# Patient Record
Sex: Female | Born: 1985 | Hispanic: Yes | Smoking: Never smoker
Health system: Southern US, Community
[De-identification: ages and names within clinical notes are randomized; demographics above are authoritative.]

## PROBLEM LIST (undated history)

## (undated) ENCOUNTER — Inpatient Hospital Stay (HOSPITAL_COMMUNITY): Payer: Self-pay

## (undated) DIAGNOSIS — D696 Thrombocytopenia, unspecified: Secondary | ICD-10-CM

## (undated) DIAGNOSIS — F32A Depression, unspecified: Secondary | ICD-10-CM

## (undated) DIAGNOSIS — F329 Major depressive disorder, single episode, unspecified: Secondary | ICD-10-CM

## (undated) DIAGNOSIS — A609 Anogenital herpesviral infection, unspecified: Secondary | ICD-10-CM

## (undated) HISTORY — PX: NO PAST SURGERIES: SHX2092

---
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
## 2007-03-23 ENCOUNTER — Ambulatory Visit: Payer: Self-pay | Admitting: Family Medicine

## 2009-05-15 ENCOUNTER — Emergency Department: Payer: Self-pay | Admitting: Emergency Medicine

## 2010-05-16 ENCOUNTER — Emergency Department: Payer: Self-pay | Admitting: Emergency Medicine

## 2010-05-20 ENCOUNTER — Ambulatory Visit: Payer: Self-pay | Admitting: Internal Medicine

## 2013-08-05 ENCOUNTER — Emergency Department: Payer: Self-pay | Admitting: Emergency Medicine

## 2013-11-15 ENCOUNTER — Inpatient Hospital Stay (HOSPITAL_COMMUNITY)
Admission: AD | Admit: 2013-11-15 | Discharge: 2013-11-16 | Disposition: A | Discharge status: Home / Self Care | Payer: Self-pay | Source: Ambulatory Visit | Attending: Obstetrics & Gynecology | Admitting: Obstetrics & Gynecology

## 2013-11-15 ENCOUNTER — Encounter (HOSPITAL_COMMUNITY): Payer: Self-pay | Admitting: *Deleted

## 2013-11-15 DIAGNOSIS — R109 Unspecified abdominal pain: Secondary | ICD-10-CM | POA: Insufficient documentation

## 2013-11-15 DIAGNOSIS — M549 Dorsalgia, unspecified: Secondary | ICD-10-CM | POA: Insufficient documentation

## 2013-11-15 DIAGNOSIS — O034 Incomplete spontaneous abortion without complication: Secondary | ICD-10-CM | POA: Insufficient documentation

## 2013-11-15 LAB — POCT PREGNANCY, URINE: Preg Test, Ur: POSITIVE — AB

## 2013-11-15 NOTE — MAU Note (Signed)
Pt states she has been having cramping and bleeding today. Pt states bleeding started 2wks ago.Pt states she started having cramping yesterday and became worse today.

## 2013-11-15 NOTE — MAU Note (Signed)
Pt G2 P0, LMP 08/30/2013, having lower back pain, cramping and vaginal bleeding.

## 2013-11-15 NOTE — MAU Provider Note (Signed)
Chief Complaint: Back Pain, Abdominal Cramping and Vaginal Bleeding   First Provider Initiated Contact with Patient 11/16/13 0047     SUBJECTIVE HPI: Monica Buck is a 28 y.o. G2P0010 at [redacted]w[redacted]d by LMP who presents with mid low abd cramping and bleeding x 2 weeks, worse tonight. No passage of tissue. States she has blood pregnancy test done in [place] 2 weeks ago when bleeding started, but no other testing or F/U. Does not know result. Bleeding started as spotting, but is a little heavier now. Used two pads today. Unplanned pregnancy.   Denies fever, chills, dizziness, passage of tissue, urinary complaints, GI complaints.  History reviewed. No pertinent past medical history. OB History  Gravida Para Term Preterm AB SAB TAB Ectopic Multiple Living  2    1 1         # Outcome Date GA Lbr Len/2nd Weight Sex Delivery Anes PTL Lv  2 CUR           1 SAB              No past surgical history on file. History   Social History  . Marital Status: Single    Spouse Name: N/A    Number of Children: N/A  . Years of Education: N/A   Occupational History  . Not on file.   Social History Main Topics  . Smoking status: Not on file  . Smokeless tobacco: Not on file  . Alcohol Use: Not on file  . Drug Use: Not on file  . Sexual Activity: Yes   Other Topics Concern  . Not on file   Social History Narrative  . No narrative on file   No current facility-administered medications on file prior to encounter.   No current outpatient prescriptions on file prior to encounter.   Allergies  Allergen Reactions  . Pork-Derived Products Rash   ROS: Pertinent items in HPI  OBJECTIVE Blood pressure 114/57, pulse 76, temperature 98.3 F (36.8 C), temperature source Oral, resp. rate 16, height 5\' 3"  (1.6 m), weight 63.594 kg (140 lb 3.2 oz), last menstrual period 08/30/2013. GENERAL: Well-developed, well-nourished female in no acute distress. Tearful.  HEENT: Normocephalic HEART: normal  rate RESP: normal effort ABDOMEN: Soft, NT. No CVAT.   EXTREMITIES: Nontender, no edema NEURO: Alert and oriented SPECULUM EXAM: Refused. Small amount of BRB on pad.   LAB RESULTS Results for orders placed during the hospital encounter of 11/15/13 (from the past 24 hour(s))  URINALYSIS, ROUTINE W REFLEX MICROSCOPIC     Status: Abnormal   Collection Time    11/15/13 11:20 PM      Result Value Range   Color, Urine YELLOW  YELLOW   APPearance CLEAR  CLEAR   Specific Gravity, Urine 1.010  1.005 - 1.030   pH 6.5  5.0 - 8.0   Glucose, UA NEGATIVE  NEGATIVE mg/dL   Hgb urine dipstick LARGE (*) NEGATIVE   Bilirubin Urine NEGATIVE  NEGATIVE   Ketones, ur NEGATIVE  NEGATIVE mg/dL   Protein, ur NEGATIVE  NEGATIVE mg/dL   Urobilinogen, UA 0.2  0.0 - 1.0 mg/dL   Nitrite NEGATIVE  NEGATIVE   Leukocytes, UA NEGATIVE  NEGATIVE  URINE MICROSCOPIC-ADD ON     Status: None   Collection Time    11/15/13 11:20 PM      Result Value Range   Squamous Epithelial / LPF RARE  RARE   WBC, UA 0-2  <3 WBC/hpf   RBC / HPF 3-6  <3 RBC/hpf  Bacteria, UA RARE  RARE  POCT PREGNANCY, URINE     Status: Abnormal   Collection Time    11/15/13 11:28 PM      Result Value Range   Preg Test, Ur POSITIVE (*) NEGATIVE  ABO/RH     Status: None   Collection Time    11/16/13 12:00 AM      Result Value Range   ABO/RH(D) O POS    HCG, QUANTITATIVE, PREGNANCY     Status: Abnormal   Collection Time    11/16/13 12:00 AM      Result Value Range   hCG, Beta Chain, Quant, S 4974 (*) <5 mIU/mL  CBC     Status: Abnormal   Collection Time    11/16/13 12:00 AM      Result Value Range   WBC 5.9  4.0 - 10.5 K/uL   RBC 3.81 (*) 3.87 - 5.11 MIL/uL   Hemoglobin 11.9 (*) 12.0 - 15.0 g/dL   HCT 33.7 (*) 36.0 - 46.0 %   MCV 88.5  78.0 - 100.0 fL   MCH 31.2  26.0 - 34.0 pg   MCHC 35.3  30.0 - 36.0 g/dL   RDW 12.3  11.5 - 15.5 %   Platelets 125 (*) 150 - 400 K/uL    IMAGING US Ob Comp Less 14 Wks  11/16/2013   CLINICAL  DATA:  Pelvic cramping and vaginal bleeding.  EXAM: OBSTETRIC <14 WK [place] AND TRANSVAGINAL OB US  TECHNIQUE: Both transabdominal and transvaginal ultrasound examinations were performed for complete evaluation of the gestation as well as the maternal uterus, adnexal regions, and pelvic cul-de-sac. Transvaginal technique was performed to assess early pregnancy.  COMPARISON:  None.  FINDINGS: Intrauterine gestational sac: Visualized / somewhat elongated in appearance.  Yolk sac:  No  Embryo:  No  Cardiac Activity: N/A  MSD: 23.5 mm   7 w   3  d  Maternal uterus/adnexae: No subchorionic hemorrhage is noted. The uterus is otherwise unremarkable in appearance.  The ovaries are within normal limits. The right ovary measures 2.2 x 1.4 x 1.3 cm, while the left ovary measures 2.5 x 1.8 x 1.9 cm. No suspicious adnexal masses are seen; there is no evidence for ovarian torsion.  No free fluid is seen within the pelvic cul-de-sac.  IMPRESSION: Single intrauterine gestational sac noted within the uterus, somewhat elongated in appearance. No yolk sac or embryo again seen. Findings are suspicious but not yet definitive for failed pregnancy. Recommend follow-up US in 10-14 days for definitive diagnosis. This recommendation follows SRU consensus guidelines: Diagnostic Criteria for Nonviable Pregnancy Early in the First Trimester. [place] [place] Med 2013WM:[id number].   Electronically Signed   By: Garald Balding M.D.   On: 11/16/2013 00:42   US Ob Transvaginal  11/16/2013   CLINICAL DATA:  Pelvic cramping and vaginal bleeding.  EXAM: OBSTETRIC <14 WK [place] AND TRANSVAGINAL OB US  TECHNIQUE: Both transabdominal and transvaginal ultrasound examinations were performed for complete evaluation of the gestation as well as the maternal uterus, adnexal regions, and pelvic cul-de-sac. Transvaginal technique was performed to assess early pregnancy.  COMPARISON:  None.  FINDINGS: Intrauterine gestational sac: Visualized / somewhat elongated in  appearance.  Yolk sac:  No  Embryo:  No  Cardiac Activity: N/A  MSD: 23.5 mm   7 w   3  d  Maternal uterus/adnexae: No subchorionic hemorrhage is noted. The uterus is otherwise unremarkable in appearance.  The ovaries are within normal limits. The  right ovary measures 2.2 x 1.4 x 1.3 cm, while the left ovary measures 2.5 x 1.8 x 1.9 cm. No suspicious adnexal masses are seen; there is no evidence for ovarian torsion.  No free fluid is seen within the pelvic cul-de-sac.  IMPRESSION: Single intrauterine gestational sac noted within the uterus, somewhat elongated in appearance. No yolk sac or embryo again seen. Findings are suspicious but not yet definitive for failed pregnancy. Recommend follow-up US in 10-14 days for definitive diagnosis. This recommendation follows SRU consensus guidelines: Diagnostic Criteria for Nonviable Pregnancy Early in the First Trimester. [employer] [employer] Med 2013; 220:2542-70.   Electronically Signed   By: Garald Balding M.D.   On: 11/16/2013 00:42   MAU COURSE Lengthy discussion w/ pt and FOB about US findings. Recommended F/U appt in clinic in 1 week and repeat US for confirmation of SAB. Can then discuss management options. Pt wants cytotec now. Does not want to wait for F/U US. Offered interpreter to discuss results, management. Declined.        Early Intrauterine Pregnancy Failure  _X__  Documented intrauterine pregnancy failure less than or equal to [redacted] weeks gestation  _X__  No serious current illness  _X__  Baseline Hgb greater than or equal to 10g/dl  _X__  Patient has easily accessible transportation to the hospital  _X__  Clear preference  _X__  Practitioner/physician deems patient reliable  _X__  Counseling by practitioner or physician  _X__  Patient education by RN  _X__  Consent given  _NA_  Rho-Gam given by RN if indicated  _X__ Medication dispensed   _X__   Cytotec 800 mcg  __   Intravaginally by patient at home         _X_   Intravaginally by RN in  MAU        __   Rectally by patient at home        __   Rectally by RN in MAU  ___  Ibuprofen 600 mg 1 tablet by mouth every 6 hours as needed #30  _X__  Hydrocodone/acetaminophen 5/325 mg by mouth every 4 to 6 hours as needed  _X__  Phenergan 12.5 mg by mouth every 4 hours as needed for nausea   ASSESSMENT 1. Incomplete miscarriage    PLAN Discharge home in stable condition.  Quant 11/19/2013 at [facility] . SAB precautions. Support given.  GC/CT pending Follow-up Information   Follow up with [facility] In 1 week.   Specialty:  Obstetrics and Gynecology   Contact information:   [place] [address] [phone]      Follow up with [facility]. (As needed if symptoms worsen)    Contact information:   [address] [id number] [place] [address] [phone]         Follow-up Information   Follow up with [facility] In 1 week.   Specialty:  Obstetrics and Gynecology   Contact information:   [place] [address] [phone]      Follow up with [facility]. (As needed if symptoms worsen)    Contact information:   [address] [id number] [place] [address] [phone]       Medication List         oxyCODONE-acetaminophen 5-325 MG per tablet  Commonly known as:  PERCOCET/ROXICET  Take 1-2 tablets by mouth every 4 (four) hours as needed for severe  pain.     promethazine 50 MG tablet  Commonly known as:  PHENERGAN  Take 1 tablet (50 mg total) by mouth every 6 (six) hours as needed for nausea or vomiting.       [place], CNM 11/16/2013  1:34 AM

## 2013-11-16 ENCOUNTER — Inpatient Hospital Stay (HOSPITAL_COMMUNITY): Payer: Self-pay

## 2013-11-16 DIAGNOSIS — O034 Incomplete spontaneous abortion without complication: Secondary | ICD-10-CM

## 2013-11-16 LAB — URINALYSIS, ROUTINE W REFLEX MICROSCOPIC
BILIRUBIN URINE: NEGATIVE
GLUCOSE, UA: NEGATIVE mg/dL
KETONES UR: NEGATIVE mg/dL
Leukocytes, UA: NEGATIVE
Nitrite: NEGATIVE
PH: 6.5 (ref 5.0–8.0)
Protein, ur: NEGATIVE mg/dL
Specific Gravity, Urine: 1.01 (ref 1.005–1.030)
Urobilinogen, UA: 0.2 mg/dL (ref 0.0–1.0)

## 2013-11-16 LAB — CBC
HEMATOCRIT: 33.7 % — AB (ref 36.0–46.0)
Hemoglobin: 11.9 g/dL — ABNORMAL LOW (ref 12.0–15.0)
MCH: 31.2 pg (ref 26.0–34.0)
MCHC: 35.3 g/dL (ref 30.0–36.0)
MCV: 88.5 fL (ref 78.0–100.0)
Platelets: 125 10*3/uL — ABNORMAL LOW (ref 150–400)
RBC: 3.81 MIL/uL — AB (ref 3.87–5.11)
RDW: 12.3 % (ref 11.5–15.5)
WBC: 5.9 10*3/uL (ref 4.0–10.5)

## 2013-11-16 LAB — ABO/RH: ABO/RH(D): O POS

## 2013-11-16 LAB — HCG, QUANTITATIVE, PREGNANCY: HCG, BETA CHAIN, QUANT, S: 4974 m[IU]/mL — AB (ref <5)

## 2013-11-16 LAB — URINE MICROSCOPIC-ADD ON

## 2013-11-16 MED ORDER — OXYCODONE-ACETAMINOPHEN 5-325 MG PO TABS
2.0000 | ORAL_TABLET | Freq: Once | ORAL | Status: AC
Start: 2013-11-16 — End: 2013-11-16
  Administered 2013-11-16: 2 via ORAL
  Filled 2013-11-16: qty 2

## 2013-11-16 MED ORDER — PROMETHAZINE HCL 50 MG PO TABS
50.0000 mg | ORAL_TABLET | Freq: Four times a day (QID) | ORAL | Status: DC | PRN
Start: 2013-11-16 — End: 2014-03-29

## 2013-11-16 MED ORDER — OXYCODONE-ACETAMINOPHEN 5-325 MG PO TABS: 1.0000 | ORAL_TABLET | ORAL | Status: DC | PRN

## 2013-11-16 MED ORDER — MISOPROSTOL 200 MCG PO TABS
800.0000 ug | ORAL_TABLET | Freq: Once | ORAL | Status: AC
  Administered 2013-11-16: 800 ug via VAGINAL
  Filled 2013-11-16: qty 4

## 2013-11-16 NOTE — Discharge Instructions (Signed)
Aborto espontáneo  °(Miscarriage) °El aborto espontáneo es la pérdida de un bebé que no ha nacido (feto) antes de la semana 20 del embarazo. La mayor parte de estos abortos ocurre en los primeros 3 meses. En algunos casos ocurre antes de que la mujer sepa que está embarazada. También se denomina "aborto espontáneo" o "pérdida prematura del embarazo". El aborto espontáneo puede ser una experiencia que afecte emocionalmente a la persona. Converse con su médico si tiene dudas, cómo es el proceso de duelo, y sobre planes futuros de embarazo.  °CAUSAS  °· Algunos problemas cromosómicos pueden hacer imposible que el bebé se desarrolle normalmente. Los problemas con los genes o cromosomas del bebé son generalmente el resultado de errores que se producen, por casualidad, cuando el embrión se divide y crece. Estos problemas no se heredan de los padres. °· Infección en el cuello del útero.   °· Problemas hormonales.   °· Problemas en el cuello del útero, como tener un útero incompetente. Esto ocurre cuando los tejidos no son lo suficientemente fuertes como para contener el embarazo.   °· Problemas del útero, como un útero con forma anormal, los fibromas o anormalidades congénitas.   °· Ciertas enfermedades crónicas.   °· No fume, no beba alcohol, ni consuma drogas.   °· Traumatismos   °A veces, la causa es desconocida.  °SÍNTOMAS  °· Sangrado o manchado vaginal, con o sin cólicos o dolor. °· Dolor o cólicos en el abdomen o en la cintura. °· Eliminación de líquido, tejidos o coágulos grandes por la vagina. °DIAGNÓSTICO  °El médico le hará un examen físico. También le indicará una ecografía para confirmar el aborto. Es posible que se realicen análisis de sangre.  °TRATAMIENTO  °· En algunos casos el tratamiento no es necesario, si se eliminan naturalmente todos los tejidos embrionarios que se encontraban en el útero. Si el feto o la placenta quedan dentro del útero (aborto incompleto), pueden infectarse, los tejidos que quedan  pueden infectarse y deben retirarse. Generalmente se realiza un procedimiento de dilatación y curetaje (D y C). Durante el procedimiento de dilatación y curetaje, el cuello del útero se abre (dilata) y se retira cualquier resto de tejido fetal o placentario del útero. °· Si hay una infección, le recetarán antibióticos. Podrán recetarle otros medicamentos para reducir el tamaño del útero (contraerlo) si hay una mucho sangrado. °· Si su sangre es Rh negativa y su bebé es Rh positivo, usted necesitará la inyección de inmunoglobulina Rh. Esta inyección protegerá a los futuros bebés de tener problemas de compatibilidad Rh en futuros embarazos. °INSTRUCCIONES PARA EL CUIDADO EN EL HOGAR  °· El médico le indicará reposo en cama o le permitirá realizar actividades livianas. Vuelva a la actividad lentamente o según las indicaciones de su médico. °· Pídale a alguien que la ayude con las responsabilidades familiares y del hogar durante este tiempo.   °· Lleve un registro de la cantidad y la saturación de las toallas higiénicas que utiliza cada día. Anote esta información   °· No use tampones. No No se haga duchas vaginales ni tenga relaciones sexuales hasta que el médico la autorice.   °· Sólo tome medicamentos de venta libre o recetados para calmar el dolor o el malestar, según las indicaciones de su médico.   °· No tome aspirina. La aspirina puede ocasionar hemorragias.   °· Concurra puntualmente a las citas de control con el médico.   °· Si usted o su pareja tienen dificultades con el duelo, hable con su médico para buscar la ayuda psicológica que los ayude a enfrentar la pérdida   del embarazo. Permítase el tiempo suficiente de duelo antes de quedar embarazada nuevamente.   °SOLICITE ATENCIÓN MÉDICA DE INMEDIATO SI:  °· Siente calambres intensos o dolor en la espalda o en el abdomen. °· Tiene fiebre. °· Elimina grandes coágulos de sangre (del tamaño de una nuez o más) o tejidos por la vagina. Guarde lo que ha eliminado para  que su médico lo examine.   °· La hemorragia aumenta.   °· Observa una secreción vaginal espesa y con mal olor. °· Se siente mareada, débil, o se desmaya.   °· Siente escalofríos.   °ASEGÚRESE DE QUE:  °· Comprende estas instrucciones. °· Controlará su enfermedad. °· Solicitará ayuda de inmediato si no mejora o si empeora. °Document Released: 07/21/2005 Document Revised: 02/05/2013 °ExitCare® Patient Information ©2014 [employer]. ° °

## 2013-11-22 ENCOUNTER — Other Ambulatory Visit: Payer: Self-pay

## 2013-11-22 DIAGNOSIS — O034 Incomplete spontaneous abortion without complication: Secondary | ICD-10-CM

## 2013-11-23 LAB — HCG, QUANTITATIVE, PREGNANCY: hCG, Beta Chain, Quant, S: 95.6 m[IU]/mL

## 2013-11-23 NOTE — MAU Provider Note (Signed)
Attestation of Attending Supervision of Advanced Practitioner (CNM/NP): Evaluation and management procedures were performed by the Advanced Practitioner under my supervision and collaboration. I have reviewed the Advanced Practitioner's note and chart, and I agree with the management and plan.  LEGGETT,KELLY H. 5:02 PM

## 2013-11-26 ENCOUNTER — Telehealth: Payer: Self-pay | Admitting: *Deleted

## 2013-11-26 NOTE — Telephone Encounter (Signed)
Message copied by Samuel Germany on Mon Nov 26, 2013  9:06 AM ------      Message from: Guss Bunde      Created: Fri Nov 23, 2013  4:00 PM       Pt has had substantial drop in BHCG.  Clinic staff to call pt to return in 1 week for BHCG. (1 week from last blood draw). ------

## 2013-11-26 NOTE — Telephone Encounter (Signed)
Called [facility] and unable to leave a message- heard a message voicemail not set up.

## 2013-11-28 NOTE — Telephone Encounter (Signed)
Called pt and informed pt that her beta levels have dropped significantly and that the provider would like for her to come in on Thursday for another quant.  Pt stated that she would be able to come in on 11/29/12 at 3pm.

## 2013-11-29 ENCOUNTER — Other Ambulatory Visit: Payer: Self-pay

## 2013-11-29 DIAGNOSIS — O034 Incomplete spontaneous abortion without complication: Secondary | ICD-10-CM

## 2013-11-30 LAB — HCG, QUANTITATIVE, PREGNANCY: hCG, Beta Chain, Quant, S: 7.8 m[IU]/mL

## 2013-12-06 ENCOUNTER — Ambulatory Visit (INDEPENDENT_AMBULATORY_CARE_PROVIDER_SITE_OTHER): Payer: Self-pay | Admitting: Nurse Practitioner

## 2013-12-06 ENCOUNTER — Encounter: Payer: Self-pay | Admitting: Nurse Practitioner

## 2013-12-06 VITALS — BP 99/51 | HR 67 | Ht 63.0 in | Wt 139.2 lb

## 2013-12-06 DIAGNOSIS — O039 Complete or unspecified spontaneous abortion without complication: Secondary | ICD-10-CM | POA: Insufficient documentation

## 2013-12-06 NOTE — Progress Notes (Addendum)
History:  Monica Buck a 28 y.o. G2P0020 who presents to [facility] today for follow up om SAB from Jan 23. She has been followed down to BHCG of 7.8 on 2/5. She denies any pain or bleeding. She would like to try again asap for pregnancy  The following portions of the patient's history were reviewed and updated as appropriate: allergies, current medications, past family history, past medical history, past social history, past surgical history and problem list.  Review of Systems:  Pertinent items are noted in HPI.  Objective:  Physical Exam BP 99/51  Pulse 67  Ht 5\' 3"  (1.6 m)  Wt 139 lb 3.2 oz (63.141 kg)  BMI 24.66 kg/m2  LMP 08/30/2013  Breastfeeding? Unknown GENERAL: Well-developed, well-nourished female in no acute distress.  HEENT: Normocephalic, atraumatic.  NECK: Supple. Normal thyroid.  LUNGS: Normal rate. Clear to auscultation bilaterally.  HEART: Regular rate and rhythm with no adventitious sounds.  EXTREMITIES: No cyanosis, clubbing, or edema, 2+ distal pulses.   Labs and Imaging US Ob Comp Less 14 Wks  11/16/2013   CLINICAL DATA:  Pelvic cramping and vaginal bleeding.  EXAM: OBSTETRIC <14 WK [place] AND TRANSVAGINAL OB US  TECHNIQUE: Both transabdominal and transvaginal ultrasound examinations were performed for complete evaluation of the gestation as well as the maternal uterus, adnexal regions, and pelvic cul-de-sac. Transvaginal technique was performed to assess early pregnancy.  COMPARISON:  None.  FINDINGS: Intrauterine gestational sac: Visualized / somewhat elongated in appearance.  Yolk sac:  No  Embryo:  No  Cardiac Activity: N/A  MSD: 23.5 mm   7 w   3  d  Maternal uterus/adnexae: No subchorionic hemorrhage is noted. The uterus is otherwise unremarkable in appearance.  The ovaries are within normal limits. The right ovary measures 2.2 x 1.4 x 1.3 cm, while the left ovary measures 2.5 x 1.8 x 1.9 cm. No suspicious adnexal masses are seen; there is no evidence  for ovarian torsion.  No free fluid is seen within the pelvic cul-de-sac.  IMPRESSION: Single intrauterine gestational sac noted within the uterus, somewhat elongated in appearance. No yolk sac or embryo again seen. Findings are suspicious but not yet definitive for failed pregnancy. Recommend follow-up US in 10-14 days for definitive diagnosis. This recommendation follows SRU consensus guidelines: Diagnostic Criteria for Nonviable Pregnancy Early in the First Trimester. [facility] [employer] Med 2013; 016:0109-32.   Electronically Signed   By: Garald Balding M.D.   On: 11/16/2013 00:42   US Ob Transvaginal     Assessment & Plan:  Assessment:  SAB  Plans:  Repeat BHCG today Advised to wait 3 full cycles before attempting pregnancy Return to [facility] as needed  Olegario Messier, NP 12/06/2013 1:20 PM

## 2013-12-06 NOTE — Patient Instructions (Signed)
Aborto espontneo  (Miscarriage) El aborto espontneo es la prdida de un beb que no ha nacido (feto) antes de la semana 20 del Media planner. La mayor parte de estos abortos ocurre en los primeros 3 meses. En algunos casos ocurre antes de que la mujer sepa que est [place]. Tambin se denomina "aborto espontneo" o "prdida prematura del embarazo". El aborto espontneo puede ser Ardelia Mems experiencia que afecte emocionalmente a Geologist, engineering. Converse con su mdico si tiene dudas, cmo es el proceso de [place], y sobre planes futuros de Media planner.  CAUSAS   Algunos problemas cromosmicos pueden hacer imposible que el beb se desarrolle normalmente. Los problemas con los genes o cromosomas del beb son generalmente el resultado de errores que se producen, por casualidad, cuando el embrin se divide y crece. Estos problemas no se heredan de los [place].  Infeccin en el cuello del tero.   Problemas hormonales.   Problemas en el cuello del tero, como tener un tero incompetente. Esto ocurre cuando los tejidos no son lo suficientemente fuertes como para Risk manager.   Problemas del tero, como un tero con forma anormal, los fibromas o anormalidades congnitas.   Ciertas enfermedades crnicas.   No fume, no beba alcohol, ni consuma drogas.   Traumatismos  A veces, la causa es desconocida.  SNTOMAS   Sangrado o manchado vaginal, con o sin clicos o dolor.  Dolor o clicos en el abdomen o en la cintura.  Eliminacin de lquido, tejidos o cogulos grandes por la vagina. DIAGNSTICO  El [employer] har un examen fsico. Tambin le indicar una ecografa para confirmar el aborto. Es posible que se realicen anlisis de [place].  TRATAMIENTO   En algunos casos el tratamiento no es necesario, si se eliminan naturalmente todos los tejidos embrionarios que se encontraban en el tero. Si el feto o la placenta quedan dentro del tero (aborto incompleto), pueden infectarse, los tejidos que quedan  pueden infectarse y deben retirarse. Generalmente se realiza un procedimiento de dilatacin y curetaje (D y C). Durante el procedimiento de dilatacin y curetaje, el cuello del tero se abre (dilata) y se retira cualquier resto de tejido fetal o placentario del tero.  Si hay una infeccin, le recetarn antibiticos. Podrn recetarle otros medicamentos para reducir el tamao del tero (contraerlo) si hay una mucho sangrado.  Si su sangre es Rh negativa y su beb es Rh positivo, usted necesitar la inyeccin de inmunoglobulina Rh. Esta inyeccin proteger a los futuros bebs de tener problemas de compatibilidad Rh en futuros embarazos. INSTRUCCIONES PARA EL CUIDADO EN EL HOGAR   El mdico le indicar reposo en cama o le permitir Automotive engineer. Vuelva a la actividad lentamente o segn las indicaciones de su mdico.  Pdale a alguien que la ayude con las responsabilidades familiares y del hogar durante este tiempo.   Lleve un registro de la cantidad y la saturacin de las toallas higinicas que Medical laboratory scientific officer. Anote esta informacin   No use tampones. No No se haga duchas vaginales ni tenga relaciones sexuales hasta que el mdico la autorice.   Slo tome medicamentos de venta libre o recetados para Glass blower/designer o Health and safety inspector, segn las indicaciones de su mdico.   No tome aspirina. La aspirina puede ocasionar hemorragias.   Concurra puntualmente a las citas de control con el mdico.   Si usted o su pareja tienen dificultades con el duelo, hable con su mdico para buscar la ayuda psicolgica que los ayude a enfrentar la prdida  del embarazo. Permítase el tiempo suficiente de duelo antes de quedar embarazada nuevamente.   °SOLICITE ATENCIÓN MÉDICA DE INMEDIATO SI:  °· Siente calambres intensos o dolor en la espalda o en el abdomen. °· Tiene fiebre. °· Elimina grandes coágulos de sangre (del tamaño de una nuez o más) o tejidos por la vagina. Guarde lo que ha eliminado para  que su médico lo examine.   °· La hemorragia aumenta.   °· Observa una secreción vaginal espesa y con mal olor. °· Se siente mareada, débil, o se desmaya.   °· Siente escalofríos.   °ASEGÚRESE DE QUE:  °· Comprende estas instrucciones. °· Controlará su enfermedad. °· Solicitará ayuda de inmediato si no mejora o si empeora. °Document Released: 07/21/2005 Document Revised: 02/05/2013 °ExitCare® Patient Information ©2014 [employer]. ° °

## 2013-12-07 LAB — HCG, QUANTITATIVE, PREGNANCY: hCG, Beta Chain, Quant, S: <2 m[IU]/mL

## 2014-03-29 ENCOUNTER — Inpatient Hospital Stay (HOSPITAL_COMMUNITY): Payer: Self-pay

## 2014-03-29 ENCOUNTER — Encounter (HOSPITAL_COMMUNITY): Payer: Self-pay

## 2014-03-29 ENCOUNTER — Inpatient Hospital Stay (HOSPITAL_COMMUNITY)
Admission: AD | Admit: 2014-03-29 | Discharge: 2014-03-30 | Disposition: A | Discharge status: Home / Self Care | Payer: Self-pay | Source: Ambulatory Visit | Attending: Obstetrics and Gynecology | Admitting: Obstetrics and Gynecology

## 2014-03-29 DIAGNOSIS — B9689 Other specified bacterial agents as the cause of diseases classified elsewhere: Secondary | ICD-10-CM | POA: Insufficient documentation

## 2014-03-29 DIAGNOSIS — D259 Leiomyoma of uterus, unspecified: Secondary | ICD-10-CM | POA: Insufficient documentation

## 2014-03-29 DIAGNOSIS — N949 Unspecified condition associated with female genital organs and menstrual cycle: Secondary | ICD-10-CM | POA: Insufficient documentation

## 2014-03-29 DIAGNOSIS — N760 Acute vaginitis: Secondary | ICD-10-CM | POA: Insufficient documentation

## 2014-03-29 DIAGNOSIS — N83209 Unspecified ovarian cyst, unspecified side: Secondary | ICD-10-CM | POA: Insufficient documentation

## 2014-03-29 DIAGNOSIS — A499 Bacterial infection, unspecified: Secondary | ICD-10-CM | POA: Insufficient documentation

## 2014-03-29 DIAGNOSIS — R109 Unspecified abdominal pain: Secondary | ICD-10-CM | POA: Insufficient documentation

## 2014-03-29 LAB — CBC WITH DIFFERENTIAL/PLATELET
BASOS PCT: 0 % (ref 0–1)
Basophils Absolute: 0 10*3/uL (ref 0.0–0.1)
EOS ABS: 0.1 10*3/uL (ref 0.0–0.7)
Eosinophils Relative: 1 % (ref 0–5)
HCT: 38.3 % (ref 36.0–46.0)
Hemoglobin: 13.6 g/dL (ref 12.0–15.0)
Lymphocytes Relative: 37 % (ref 12–46)
Lymphs Abs: 2.6 10*3/uL (ref 0.7–4.0)
MCH: 31.6 pg (ref 26.0–34.0)
MCHC: 35.5 g/dL (ref 30.0–36.0)
MCV: 88.9 fL (ref 78.0–100.0)
Monocytes Absolute: 0.4 10*3/uL (ref 0.1–1.0)
Monocytes Relative: 6 % (ref 3–12)
NEUTROS PCT: 55 % (ref 43–77)
Neutro Abs: 3.7 10*3/uL (ref 1.7–7.7)
Platelets: 114 10*3/uL — ABNORMAL LOW (ref 150–400)
RBC: 4.31 MIL/uL (ref 3.87–5.11)
RDW: 12.6 % (ref 11.5–15.5)
WBC: 6.8 10*3/uL (ref 4.0–10.5)

## 2014-03-29 LAB — URINALYSIS, ROUTINE W REFLEX MICROSCOPIC
Bilirubin Urine: NEGATIVE
Glucose, UA: NEGATIVE mg/dL
HGB URINE DIPSTICK: NEGATIVE
Ketones, ur: NEGATIVE mg/dL
Leukocytes, UA: NEGATIVE
Nitrite: NEGATIVE
Protein, ur: NEGATIVE mg/dL
SPECIFIC GRAVITY, URINE: 1.02 (ref 1.005–1.030)
Urobilinogen, UA: 0.2 mg/dL (ref 0.0–1.0)
pH: 7 (ref 5.0–8.0)

## 2014-03-29 LAB — BASIC METABOLIC PANEL
BUN: 12 mg/dL (ref 6–23)
CO2: 26 mEq/L (ref 19–32)
Calcium: 9.6 mg/dL (ref 8.4–10.5)
Chloride: 99 mEq/L (ref 96–112)
Creatinine, Ser: 0.6 mg/dL (ref 0.50–1.10)
Glucose, Bld: 92 mg/dL (ref 70–99)
POTASSIUM: 4.1 meq/L (ref 3.7–5.3)
SODIUM: 136 meq/L — AB (ref 137–147)

## 2014-03-29 LAB — POCT PREGNANCY, URINE: Preg Test, Ur: NEGATIVE

## 2014-03-29 LAB — WET PREP, GENITAL
Trich, Wet Prep: NONE SEEN
Yeast Wet Prep HPF POC: NONE SEEN

## 2014-03-29 MED ORDER — NAPROXEN SODIUM 550 MG PO TABS: 550.0000 mg | ORAL_TABLET | Freq: Two times a day (BID) | ORAL | Status: DC

## 2014-03-29 MED ORDER — KETOROLAC TROMETHAMINE 60 MG/2ML IM SOLN
30.0000 mg | Freq: Once | INTRAMUSCULAR | Status: AC
  Administered 2014-03-29: 30 mg via INTRAMUSCULAR
  Filled 2014-03-29: qty 2

## 2014-03-29 MED ORDER — METRONIDAZOLE 500 MG PO TABS: 500.0000 mg | ORAL_TABLET | Freq: Two times a day (BID) | ORAL | Status: DC

## 2014-03-29 NOTE — MAU Provider Note (Signed)
CSN: [id number]     Arrival date & time 03/29/14  1644 History   None    Chief Complaint  Patient presents with  . Abdominal Pain  . Nausea     (Consider location/radiation/quality/duration/timing/severity/associated sxs/prior Treatment) Patient is a 28 y.o. female presenting with abdominal pain. The history is provided by the patient. No language interpreter was used.  Abdominal Pain The primary symptoms of the illness include abdominal pain, nausea and vaginal discharge. The primary symptoms of the illness do not include fever, shortness of breath, vomiting, diarrhea, dysuria or vaginal bleeding. The current episode started more than 2 days ago. The onset of the illness was gradual.  Vaginal discharge is a new problem. The vaginal discharge is not associated with dysuria.  The patient states that she believes she is currently not pregnant. Additional symptoms associated with the illness include chills.   Monica Buck is a 28 y.o. female who presents to the ED with abdominal pain that started about a week ago. She describes the pain as a pressure feeling and cramps. She rates the pain as 9/10. She states she fells like something is going to come out of her vagina. She had a SAB in Feb. She has has normal periods since then. LMP 03/07/2014. She denies feeling dizzy, short of breath or other problems tonight.    History reviewed. No pertinent past medical history. Past Surgical History  Procedure Laterality Date  . No past surgeries     Family History  Problem Relation Age of Onset  . Cancer Father    History  Substance Use Topics  . Smoking status: Never Smoker   . Smokeless tobacco: Never Used  . Alcohol Use: Yes     Comment: occasional drink   OB History   Grav Para Term Preterm Abortions TAB SAB Ect Mult Living   2    2  2    0     Review of Systems  Constitutional: Positive for chills. Negative for fever.  HENT: Negative.   Eyes: Negative for pain and redness.   Respiratory: Negative for cough and shortness of breath.   Cardiovascular: Negative for chest pain.  Gastrointestinal: Positive for nausea and abdominal pain. Negative for vomiting and diarrhea.  Genitourinary: Positive for vaginal discharge. Negative for dysuria and vaginal bleeding.  Skin: Negative for rash.  Neurological: Negative for syncope and headaches.  Psychiatric/Behavioral: Negative for confusion. The patient is not nervous/anxious.       Allergies  Pork-derived products  Home Medications   Prior to Admission medications   Medication Sig Start Date End Date Taking? Authorizing Provider  BIOTIN PO Take 1 tablet by mouth daily.   Yes Historical Provider, MD  folic acid (FOLVITE) 093 MCG tablet Take 400 mcg by mouth daily.   Yes Historical Provider, MD  naproxen sodium (ANAPROX) 220 MG tablet Take 220 mg by mouth as needed (headache).    Yes Historical Provider, MD   BP 121/62  Pulse 73  Temp(Src) 99.1 F (37.3 C) (Oral)  Resp 16  Ht 5\' 3"  (1.6 m)  Wt 138 lb 6.4 oz (62.778 kg)  BMI 24.52 kg/m2  SpO2 99%  LMP 03/07/2013 Physical Exam  Nursing note and vitals reviewed. Constitutional: She is oriented to person, place, and time. She appears well-developed and well-nourished. No distress.  Patient is tearful  HENT:  Head: Normocephalic.  Eyes: EOM are normal.  Neck: Neck supple.  Cardiovascular: Normal rate.   Pulmonary/Chest: Effort normal.  Abdominal: Soft. There is  no tenderness.  Genitourinary:  External genitalia without lesions, frothy discharge vaginal vault. Positive CMT, bilateral adnexal tenderness. Uterus without palpable enlargement.   Musculoskeletal: Normal range of motion.  Neurological: She is alert and oriented to person, place, and time. No cranial nerve deficit.  Skin: Skin is warm and dry.  Psychiatric: She has a normal mood and affect. Her behavior is normal.    ED Course  Procedures (including critical care time) Toradol 30 mg IM Labs  Review Results for orders placed during the hospital encounter of 03/29/14 (from the past 24 hour(s))  URINALYSIS, ROUTINE W REFLEX MICROSCOPIC     Status: None   Collection Time    03/29/14  5:30 PM      Result Value Ref Range   Color, Urine YELLOW  YELLOW   APPearance CLEAR  CLEAR   Specific Gravity, Urine 1.020  1.005 - 1.030   pH 7.0  5.0 - 8.0   Glucose, UA NEGATIVE  NEGATIVE mg/dL   Hgb urine dipstick NEGATIVE  NEGATIVE   Bilirubin Urine NEGATIVE  NEGATIVE   Ketones, ur NEGATIVE  NEGATIVE mg/dL   Protein, ur NEGATIVE  NEGATIVE mg/dL   Urobilinogen, UA 0.2  0.0 - 1.0 mg/dL   Nitrite NEGATIVE  NEGATIVE   Leukocytes, UA NEGATIVE  NEGATIVE  POCT PREGNANCY, URINE     Status: None   Collection Time    03/29/14  6:06 PM      Result Value Ref Range   Preg Test, Ur NEGATIVE  NEGATIVE  WET PREP, GENITAL     Status: Abnormal   Collection Time    03/29/14  9:20 PM      Result Value Ref Range   Yeast Wet Prep HPF POC NONE SEEN  NONE SEEN   Trich, Wet Prep NONE SEEN  NONE SEEN   Clue Cells Wet Prep HPF POC MODERATE (*) NONE SEEN   WBC, Wet Prep HPF POC FEW (*) NONE SEEN  CBC WITH DIFFERENTIAL     Status: Abnormal   Collection Time    03/29/14  9:35 PM      Result Value Ref Range   WBC 6.8  4.0 - 10.5 K/uL   RBC 4.31  3.87 - 5.11 MIL/uL   Hemoglobin 13.6  12.0 - 15.0 g/dL   HCT 38.3  36.0 - 46.0 %   MCV 88.9  78.0 - 100.0 fL   MCH 31.6  26.0 - 34.0 pg   MCHC 35.5  30.0 - 36.0 g/dL   RDW 12.6  11.5 - 15.5 %   Platelets 114 (*) 150 - 400 K/uL   Neutrophils Relative % 55  43 - 77 %   Neutro Abs 3.7  1.7 - 7.7 K/uL   Lymphocytes Relative 37  12 - 46 %   Lymphs Abs 2.6  0.7 - 4.0 K/uL   Monocytes Relative 6  3 - 12 %   Monocytes Absolute 0.4  0.1 - 1.0 K/uL   Eosinophils Relative 1  0 - 5 %   Eosinophils Absolute 0.1  0.0 - 0.7 K/uL   Basophils Relative 0  0 - 1 %   Basophils Absolute 0.0  0.0 - 0.1 K/uL    US Transvaginal Non-ob  03/29/2014   CLINICAL DATA:  Pelvic pain.   EXAM: TRANSABDOMINAL AND TRANSVAGINAL ULTRASOUND OF PELVIS  TECHNIQUE: Both transabdominal and transvaginal ultrasound examinations of the pelvis were performed. Transabdominal technique was performed for global imaging of the pelvis including uterus, ovaries, adnexal regions,  and pelvic cul-de-sac. It was necessary to proceed with endovaginal exam following the transabdominal exam to visualize the ovaries and endometrium.  COMPARISON:  11/16/2013.  FINDINGS: Uterus  Measurements: 8.0 x 3.7 x 4.6 cm. Small fibroids are noted.  Endometrium  Thickness: 9.3 mm.  No focal abnormality visualized.  Right ovary  Measurements: 3.5 x 2.5 x 2.6 cm. A 1.7 x 1.7 x 1.8 cm complex cyst is noted.  Left ovary  Measurements: 3.0 x 1.4 x 2.1 cm. Normal appearance/no adnexal mass.  Other findings  Trace free pelvic fluid.  IMPRESSION: Small uterine fibroids.  Small hemorrhagic cyst associated with the right ovary.   Electronically Signed   By: Kalman Jewels M.D.   On: 03/29/2014 23:37   US Pelvis Complete  03/29/2014   CLINICAL DATA:  Pelvic pain.  EXAM: TRANSABDOMINAL AND TRANSVAGINAL ULTRASOUND OF PELVIS  TECHNIQUE: Both transabdominal and transvaginal ultrasound examinations of the pelvis were performed. Transabdominal technique was performed for global imaging of the pelvis including uterus, ovaries, adnexal regions, and pelvic cul-de-sac. It was necessary to proceed with endovaginal exam following the transabdominal exam to visualize the ovaries and endometrium.  COMPARISON:  11/16/2013.  FINDINGS: Uterus  Measurements: 8.0 x 3.7 x 4.6 cm. Small fibroids are noted.  Endometrium  Thickness: 9.3 mm.  No focal abnormality visualized.  Right ovary  Measurements: 3.5 x 2.5 x 2.6 cm. A 1.7 x 1.7 x 1.8 cm complex cyst is noted.  Left ovary  Measurements: 3.0 x 1.4 x 2.1 cm. Normal appearance/no adnexal mass.  Other findings  Trace free pelvic fluid.  IMPRESSION: Small uterine fibroids.  Small hemorrhagic cyst associated with the  right ovary.   Electronically Signed   By: Kalman Jewels M.D.   On: 03/29/2014 23:37    MDM  28 y.o. female with pelvic pain that started a week ago and has gotten worse. Pain improved with Toradol 30 mg. IM. Stable for discharge without concern for ectopic pregnancy, appendicitis or pyelonephritis. Will treat for hemorrhagic ovarian cyst and BV.  I have reviewed this patient's vital signs, nurses notes, appropriate labs and imaging.  I have discussed findings with the patient and plan of care and she voices understanding and agrees with plan. She will return for worsening symptoms.    Medication List         BIOTIN PO  Take 1 tablet by mouth daily.     folic acid 010 MCG tablet  Commonly known as:  FOLVITE  Take 400 mcg by mouth daily.     metroNIDAZOLE 500 MG tablet  Commonly known as:  FLAGYL  Take 1 tablet (500 mg total) by mouth 2 (two) times daily.     naproxen sodium 550 MG tablet  Commonly known as:  ANAPROX  Take 1 tablet (550 mg total) by mouth 2 (two) times daily with a meal.

## 2014-03-29 NOTE — MAU Note (Signed)
Patient states she has had abdominal pain for about one week all the time. State she has nausea, no vomiting.Denies bleeding or discharge.

## 2014-03-30 LAB — GC/CHLAMYDIA PROBE AMP
CT PROBE, AMP APTIMA: NEGATIVE
GC Probe RNA: NEGATIVE

## 2014-03-31 NOTE — MAU Provider Note (Signed)
Attestation of Attending Supervision of Advanced Practitioner: Evaluation and management procedures were performed by the PA/NP/CNM/OB Fellow under my supervision/collaboration. Chart reviewed and agree with management and plan.  Mallory Shirk V 03/31/2014 7:44 AM

## 2014-05-12 ENCOUNTER — Inpatient Hospital Stay (HOSPITAL_COMMUNITY)
Admission: AD | Admit: 2014-05-12 | Discharge: 2014-05-12 | Disposition: A | Discharge status: Home / Self Care | Payer: Self-pay | Source: Ambulatory Visit | Attending: Obstetrics & Gynecology | Admitting: Obstetrics & Gynecology

## 2014-05-12 ENCOUNTER — Encounter (HOSPITAL_COMMUNITY): Payer: Self-pay | Admitting: *Deleted

## 2014-05-12 DIAGNOSIS — R109 Unspecified abdominal pain: Secondary | ICD-10-CM | POA: Insufficient documentation

## 2014-05-12 DIAGNOSIS — N926 Irregular menstruation, unspecified: Secondary | ICD-10-CM | POA: Insufficient documentation

## 2014-05-12 DIAGNOSIS — Z3202 Encounter for pregnancy test, result negative: Secondary | ICD-10-CM

## 2014-05-12 DIAGNOSIS — IMO0002 Reserved for concepts with insufficient information to code with codable children: Secondary | ICD-10-CM | POA: Insufficient documentation

## 2014-05-12 DIAGNOSIS — N644 Mastodynia: Secondary | ICD-10-CM | POA: Insufficient documentation

## 2014-05-12 LAB — WET PREP, GENITAL
Clue Cells Wet Prep HPF POC: NONE SEEN
Trich, Wet Prep: NONE SEEN
Yeast Wet Prep HPF POC: NONE SEEN

## 2014-05-12 LAB — URINALYSIS, ROUTINE W REFLEX MICROSCOPIC
BILIRUBIN URINE: NEGATIVE
Glucose, UA: NEGATIVE mg/dL
Hgb urine dipstick: NEGATIVE
KETONES UR: NEGATIVE mg/dL
Leukocytes, UA: NEGATIVE
NITRITE: NEGATIVE
Protein, ur: NEGATIVE mg/dL
Specific Gravity, Urine: 1.015 (ref 1.005–1.030)
Urobilinogen, UA: 0.2 mg/dL (ref 0.0–1.0)
pH: 6 (ref 5.0–8.0)

## 2014-05-12 LAB — POCT PREGNANCY, URINE: Preg Test, Ur: NEGATIVE

## 2014-05-12 MED ORDER — IBUPROFEN 200 MG PO TABS: 600.0000 mg | ORAL_TABLET | Freq: Four times a day (QID) | ORAL | Status: DC | PRN

## 2014-05-12 NOTE — MAU Note (Signed)
Pt presents to MAU with complaints of lower abdominal pain, breast tenderness, and feeling dizzy for about a week. Reports she is a week last for her menstrual cycle

## 2014-05-12 NOTE — MAU Provider Note (Signed)
History     CSN: [id number]  Arrival date and time: 05/12/14 1355   First Provider Initiated Contact with Patient 05/12/14 1440      Chief Complaint  Patient presents with  . Abdominal Pain  . breast tenderness   . Dizziness   HPI 28 y.o. G2P0020 with low abd pain which is intermittent and crampy in nature x 2 weeks. Also c/o breast tenderness and mild dizziness. States she feels like "right before you start your period" but her period is 1 week late. Also states that she has burning discomfort w/ intercourse and her partner is having burning too. She was seen in MAU on 03/29/14 and was treated for BV, also had a 1.7 cm R hemorrhagic cyst on u/s at that time. Patient's last menstrual period was 04/05/2014.   No past medical history on file.  Past Surgical History  Procedure Laterality Date  . No past surgeries      Family History  Problem Relation Age of Onset  . Cancer Father     History  Substance Use Topics  . Smoking status: Never Smoker   . Smokeless tobacco: Never Used  . Alcohol Use: Yes     Comment: occasional drink    Allergies:  Allergies  Allergen Reactions  . Pork-Derived Products Rash    Prescriptions prior to admission  Medication Sig Dispense Refill  . acetaminophen (TYLENOL) 500 MG tablet Take 1,000 mg by mouth every 6 (six) hours as needed for moderate pain.        Review of Systems  Constitutional: Negative.   Respiratory: Negative.   Cardiovascular: Negative.   Gastrointestinal: Positive for abdominal pain. Negative for nausea, vomiting, diarrhea and constipation.  Genitourinary: Negative for dysuria, urgency, frequency, hematuria and flank pain.       Negative for vaginal bleeding, vaginal discharge, + dyspareunia  Musculoskeletal: Negative.   Neurological: Positive for dizziness.  Psychiatric/Behavioral: Negative.    Physical Exam   Blood pressure 104/59, pulse 61, resp. rate 18, last menstrual period 04/05/2014, unknown if currently  breastfeeding.  Physical Exam  Nursing note and vitals reviewed. Constitutional: She is oriented to person, place, and time. She appears well-developed and well-nourished. No distress.  HENT:  Head: Normocephalic and atraumatic.  Cardiovascular: Normal rate, regular rhythm and normal heart sounds.   Respiratory: Effort normal and breath sounds normal. No respiratory distress.  GI: Soft. Bowel sounds are normal. She exhibits no distension and no mass. There is no tenderness. There is no rebound and no guarding.  Genitourinary: There is no rash or lesion on the right labia. There is no rash or lesion on the left labia. Uterus is not deviated, not enlarged, not fixed and not tender. Cervix exhibits no motion tenderness, no discharge and no friability. Right adnexum displays no mass, no tenderness and no fullness. Left adnexum displays no mass, no tenderness and no fullness. No erythema, tenderness or bleeding around the vagina. Vaginal discharge (white) found.  General discomfort with entire pelvic exam, but no discernible tenderness on exam  Neurological: She is alert and oriented to person, place, and time.  Skin: Skin is warm and dry.  Psychiatric: She has a normal mood and affect.    MAU Course  Procedures Results for orders placed during the hospital encounter of 05/12/14 (from the past 24 hour(s))  URINALYSIS, ROUTINE W REFLEX MICROSCOPIC     Status: None   Collection Time    05/12/14  2:00 PM      Result Value  Ref Range   Color, Urine YELLOW  YELLOW   APPearance CLEAR  CLEAR   Specific Gravity, Urine 1.015  1.005 - 1.030   pH 6.0  5.0 - 8.0   Glucose, UA NEGATIVE  NEGATIVE mg/dL   Hgb urine dipstick NEGATIVE  NEGATIVE   Bilirubin Urine NEGATIVE  NEGATIVE   Ketones, ur NEGATIVE  NEGATIVE mg/dL   Protein, ur NEGATIVE  NEGATIVE mg/dL   Urobilinogen, UA 0.2  0.0 - 1.0 mg/dL   Nitrite NEGATIVE  NEGATIVE   Leukocytes, UA NEGATIVE  NEGATIVE  POCT PREGNANCY, URINE     Status: None    Collection Time    05/12/14  2:33 PM      Result Value Ref Range   Preg Test, Ur NEGATIVE  NEGATIVE  WET PREP, GENITAL     Status: Abnormal   Collection Time    05/12/14  2:45 PM      Result Value Ref Range   Yeast Wet Prep HPF POC NONE SEEN  NONE SEEN   Trich, Wet Prep NONE SEEN  NONE SEEN   Clue Cells Wet Prep HPF POC NONE SEEN  NONE SEEN   WBC, Wet Prep HPF POC FEW (*) NONE SEEN     Assessment and Plan   1. Negative pregnancy test   2. Irregular menses   3. Dyspareunia  GC/CT pending, recommend partner testing as well, since he is also having symptoms    Medication List         acetaminophen 500 MG tablet  Commonly known as:  TYLENOL  Take 1,000 mg by mouth every 6 (six) hours as needed for moderate pain.     ibuprofen 200 MG tablet  Commonly known as:  MOTRIN IB  Take 3 tablets (600 mg total) by mouth every 6 (six) hours as needed.        Follow-up Information   Follow up with [facility]. (As needed)    Specialty:  Obstetrics and Gynecology   Contact information:   [place] [id number] [address] 05/12/2014, 3:36 PM

## 2014-05-14 LAB — GC/CHLAMYDIA PROBE AMP
CT PROBE, AMP APTIMA: NEGATIVE
GC PROBE AMP APTIMA: NEGATIVE

## 2014-07-26 ENCOUNTER — Inpatient Hospital Stay (HOSPITAL_COMMUNITY)
Admission: AD | Admit: 2014-07-26 | Discharge: 2014-07-27 | Disposition: A | Discharge status: Home / Self Care | Payer: Self-pay | Source: Ambulatory Visit | Attending: Obstetrics & Gynecology | Admitting: Obstetrics & Gynecology

## 2014-07-26 ENCOUNTER — Encounter (HOSPITAL_COMMUNITY): Payer: Self-pay | Admitting: *Deleted

## 2014-07-26 ENCOUNTER — Inpatient Hospital Stay (HOSPITAL_COMMUNITY): Payer: Self-pay

## 2014-07-26 DIAGNOSIS — Z3A00 Weeks of gestation of pregnancy not specified: Secondary | ICD-10-CM | POA: Insufficient documentation

## 2014-07-26 DIAGNOSIS — R109 Unspecified abdominal pain: Secondary | ICD-10-CM

## 2014-07-26 DIAGNOSIS — O9989 Other specified diseases and conditions complicating pregnancy, childbirth and the puerperium: Secondary | ICD-10-CM | POA: Insufficient documentation

## 2014-07-26 DIAGNOSIS — O26899 Other specified pregnancy related conditions, unspecified trimester: Secondary | ICD-10-CM

## 2014-07-26 DIAGNOSIS — R1030 Lower abdominal pain, unspecified: Secondary | ICD-10-CM | POA: Insufficient documentation

## 2014-07-26 LAB — CBC
HCT: 35 % — ABNORMAL LOW (ref 36.0–46.0)
Hemoglobin: 12.3 g/dL (ref 12.0–15.0)
MCH: 31.9 pg (ref 26.0–34.0)
MCHC: 35.1 g/dL (ref 30.0–36.0)
MCV: 90.7 fL (ref 78.0–100.0)
PLATELETS: 138 10*3/uL — AB (ref 150–400)
RBC: 3.86 MIL/uL — ABNORMAL LOW (ref 3.87–5.11)
RDW: 12.9 % (ref 11.5–15.5)
WBC: 7.8 10*3/uL (ref 4.0–10.5)

## 2014-07-26 LAB — URINALYSIS, ROUTINE W REFLEX MICROSCOPIC
BILIRUBIN URINE: NEGATIVE
Glucose, UA: NEGATIVE mg/dL
Hgb urine dipstick: NEGATIVE
Ketones, ur: NEGATIVE mg/dL
Leukocytes, UA: NEGATIVE
NITRITE: NEGATIVE
Protein, ur: NEGATIVE mg/dL
Specific Gravity, Urine: 1.01 (ref 1.005–1.030)
UROBILINOGEN UA: 0.2 mg/dL (ref 0.0–1.0)
pH: 6 (ref 5.0–8.0)

## 2014-07-26 LAB — POCT PREGNANCY, URINE: Preg Test, Ur: POSITIVE — AB

## 2014-07-26 NOTE — MAU Note (Signed)
Just found out I am pregnant. Having abd pain and lower back pain. Bones in my legs hurt sometimes. Denies bleeding or d/c

## 2014-07-26 NOTE — MAU Provider Note (Signed)
History     CSN: [id number]  Arrival date and time: 07/26/14 2249   First Provider Initiated Contact with Patient 07/26/14 2323      Chief Complaint  Patient presents with  . Abdominal Pain  . Back Pain  . Leg Pain   HPI  [name] is a 28 y.o.female G2P0020 at Unknown gestation who presents with abdominal pain and back pain. She had a positive home pregnancy test. [facility] She has not started prenatal care. She has had 2 miscarriages in the past and is very anxious about this pregnancy.   OB History   Grav Para Term Preterm Abortions TAB SAB Ect Mult Living   2    2  2    0      History reviewed. No pertinent past medical history.  Past Surgical History  Procedure Laterality Date  . No past surgeries      Family History  Problem Relation Age of Onset  . Cancer Father     History  Substance Use Topics  . Smoking status: Never Smoker   . Smokeless tobacco: Never Used  . Alcohol Use: Yes     Comment: occasional drink    Allergies:  Allergies  Allergen Reactions  . Pork-Derived Products Rash    Prescriptions prior to admission  Medication Sig Dispense Refill  . acetaminophen (TYLENOL) 500 MG tablet Take 1,000 mg by mouth every 6 (six) hours as needed for moderate pain.      Marland Kitchen ibuprofen (MOTRIN IB) 200 MG tablet Take 3 tablets (600 mg total) by mouth every 6 (six) hours as needed.  30 tablet  0   Results for orders placed during the hospital encounter of 07/26/14 (from the past 48 hour(s))  URINALYSIS, ROUTINE W REFLEX MICROSCOPIC     Status: None   Collection Time    07/26/14 11:10 PM      Result Value Ref Range   Color, Urine YELLOW  YELLOW   APPearance CLEAR  CLEAR   Specific Gravity, Urine 1.010  1.005 - 1.030   pH 6.0  5.0 - 8.0   Glucose, UA NEGATIVE  NEGATIVE mg/dL   Hgb urine dipstick NEGATIVE  NEGATIVE   Bilirubin Urine NEGATIVE  NEGATIVE   Ketones, ur NEGATIVE  NEGATIVE mg/dL   Protein, ur NEGATIVE  NEGATIVE mg/dL   Urobilinogen, UA 0.2  0.0 - 1.0 mg/dL   Nitrite NEGATIVE  NEGATIVE   Leukocytes, UA NEGATIVE  NEGATIVE   Comment: MICROSCOPIC NOT DONE ON URINES WITH NEGATIVE PROTEIN, BLOOD, LEUKOCYTES, NITRITE, OR GLUCOSE <1000 mg/dL.  POCT PREGNANCY, URINE     Status: Abnormal   Collection Time    07/26/14 11:16 PM      Result Value Ref Range   Preg Test, Ur POSITIVE (*) NEGATIVE   Comment:            THE SENSITIVITY OF THIS     METHODOLOGY IS >24 mIU/mL  HCG, QUANTITATIVE, PREGNANCY     Status: Abnormal   Collection Time    07/26/14 11:30 PM      Result Value Ref Range   hCG, Beta Chain, Quant, S 6791 (*) <5 mIU/mL   Comment:              GEST. AGE      CONC.  (mIU/mL)       <=1 WEEK        5 - 50  2 WEEKS       50 - 500         3 WEEKS       100 - 10,000         4 WEEKS     1,000 - 30,000         5 WEEKS     3,500 - 115,000       6-8 WEEKS     12,000 - 270,000        12 WEEKS     15,000 - 220,000                FEMALE AND NON-PREGNANT FEMALE:         LESS THAN 5 mIU/mL  WET PREP, GENITAL     Status: Abnormal   Collection Time    07/26/14 11:35 PM      Result Value Ref Range   Yeast Wet Prep HPF POC NONE SEEN  NONE SEEN   Trich, Wet Prep NONE SEEN  NONE SEEN   Clue Cells Wet Prep HPF POC FEW (*) NONE SEEN   WBC, Wet Prep HPF POC FEW (*) NONE SEEN   Comment: MODERATE BACTERIA SEEN  CBC     Status: Abnormal   Collection Time    07/26/14 11:35 PM      Result Value Ref Range   WBC 7.8  4.0 - 10.5 K/uL   RBC 3.86 (*) 3.87 - 5.11 MIL/uL   Hemoglobin 12.3  12.0 - 15.0 g/dL   HCT 35.0 (*) 36.0 - 46.0 %   MCV 90.7  78.0 - 100.0 fL   MCH 31.9  26.0 - 34.0 pg   MCHC 35.1  30.0 - 36.0 g/dL   RDW 12.9  11.5 - 15.5 %   Platelets 138 (*) 150 - 400 K/uL   US Ob Comp Less 14 Wks  07/27/2014   CLINICAL DATA:  Patient with lower abdominal pain. Patient is pregnant.  EXAM: OBSTETRIC <14 WK [place] AND TRANSVAGINAL OB US  TECHNIQUE: Both transabdominal and  transvaginal ultrasound examinations were performed for complete evaluation of the gestation as well as the maternal uterus, adnexal regions, and pelvic cul-de-sac. Transvaginal technique was performed to assess early pregnancy.  COMPARISON:  Pelvic ultrasound 03/29/2014  FINDINGS: Intrauterine gestational sac: Visualized/normal in shape.  Yolk sac:  Not present  Embryo:  Not present  Cardiac Activity: Not present  MSD:  7.1 mm  mm   5 w   2  d  Maternal uterus/adnexae: Suboptimal visualization of the right ovary. Corpus luteal cyst noted within the left ovary. Small amount of free fluid in the pelvis. No subchorionic hemorrhage.  IMPRESSION: Probable early intrauterine gestational sac, but no yolk sac, fetal pole, or cardiac activity yet visualized. Recommend follow-up quantitative B-HCG levels and follow-up US in 14 days to confirm and assess viability. This recommendation follows SRU consensus guidelines: Diagnostic Criteria for Nonviable Pregnancy Early in the First Trimester. [employer] Med 2013; 824:2353-61.   Electronically Signed   By: Lovey Newcomer M.D.   On: 07/27/2014 01:00   US Ob Transvaginal  07/27/2014   CLINICAL DATA:  Patient with lower abdominal pain. Patient is pregnant.  EXAM: OBSTETRIC <14 WK [place] AND TRANSVAGINAL OB US  TECHNIQUE: Both transabdominal and transvaginal ultrasound examinations were performed for complete evaluation of the gestation as well as the maternal uterus, adnexal regions, and pelvic cul-de-sac. Transvaginal technique was performed to assess early pregnancy.  COMPARISON:  Pelvic ultrasound 03/29/2014  FINDINGS: Intrauterine gestational sac: Visualized/normal in shape.  Yolk sac:  Not present  Embryo:  Not present  Cardiac Activity: Not present  MSD:  7.1 mm  mm   5 w   2  d  Maternal uterus/adnexae: Suboptimal visualization of the right ovary. Corpus luteal cyst noted within the left ovary. Small amount of free fluid in the pelvis. No subchorionic hemorrhage.  IMPRESSION:  Probable early intrauterine gestational sac, but no yolk sac, fetal pole, or cardiac activity yet visualized. Recommend follow-up quantitative B-HCG levels and follow-up US in 14 days to confirm and assess viability. This recommendation follows SRU consensus guidelines: Diagnostic Criteria for Nonviable Pregnancy Early in the First Trimester. [employer] Med 2013; 081:4481-85.   Electronically Signed   By: Lovey Newcomer M.D.   On: 07/27/2014 01:00    Review of Systems  Constitutional: Negative for fever and chills.  Gastrointestinal: Positive for abdominal pain (Lower abdominal pain ).  Genitourinary:       No vaginal discharge. No vaginal bleeding. No dysuria.   Musculoskeletal: Positive for back pain.       + bilateral leg cramps   Physical Exam   Blood pressure 115/64, pulse 73, temperature 98.4 F (36.9 C), resp. rate 18, height 5\' 3"  (1.6 m), weight 65.499 kg (144 lb 6.4 oz), last menstrual period 06/18/2014, unknown if currently breastfeeding.  Physical Exam  Constitutional: She is oriented to person, place, and time. She appears well-developed and well-nourished. No distress.  HENT:  Head: Normocephalic.  Eyes: Pupils are equal, round, and reactive to light.  Neck: Neck supple.  Respiratory: Effort normal.  GI: Normal appearance. There is generalized tenderness. There is no rigidity, no rebound and no guarding.  Genitourinary:  Speculum exam: Vagina - Small amount of creamy discharge, no odor Cervix - No contact bleeding Bimanual exam: Cervix closed Uterus non tender, enlarged  Adnexa non tender, no masses bilaterally GC/Chlam, wet prep done Chaperone present for exam.   Musculoskeletal: Normal range of motion.  Neurological: She is alert and oriented to person, place, and time.  Skin: Skin is warm. She is not diaphoretic.  Psychiatric: Her behavior is normal.    MAU Course  Procedures None  MDM UA UPT CBC Beta Hcg ABO Wet prep GC HIV [place]   Assessment and  Plan   A: 1. Abdominal pain in pregnancy   2.  Probable intrauterine gestational sac; cannot rule out ectopic pregnancy    P: Discharge home in stable condition  Return to the clinic on Monday morning for repeat beta hcg  Ectopic precautions Pelvic rest Return to MAU as needed, if symptoms worsen Support given    Darrelyn Hillock Geanie Pacifico, NP 07/27/2014 8:21 AM

## 2014-07-27 ENCOUNTER — Encounter (HOSPITAL_COMMUNITY): Payer: Self-pay | Admitting: *Deleted

## 2014-07-27 LAB — WET PREP, GENITAL
Trich, Wet Prep: NONE SEEN
Yeast Wet Prep HPF POC: NONE SEEN

## 2014-07-27 LAB — HIV ANTIBODY (ROUTINE TESTING W REFLEX): HIV: NONREACTIVE

## 2014-07-27 LAB — HCG, QUANTITATIVE, PREGNANCY: hCG, Beta Chain, Quant, S: 6791 m[IU]/mL — ABNORMAL HIGH (ref <5)

## 2014-07-27 NOTE — Discharge Instructions (Signed)

## 2014-07-29 ENCOUNTER — Other Ambulatory Visit: Payer: Self-pay

## 2014-07-29 ENCOUNTER — Telehealth: Payer: Self-pay

## 2014-07-29 DIAGNOSIS — N939 Abnormal uterine and vaginal bleeding, unspecified: Secondary | ICD-10-CM

## 2014-07-29 LAB — HCG, QUANTITATIVE, PREGNANCY: HCG, BETA CHAIN, QUANT, S: 12702 m[IU]/mL

## 2014-07-29 LAB — GC/CHLAMYDIA PROBE AMP
CT PROBE, AMP APTIMA: NEGATIVE
GC PROBE AMP APTIMA: NEGATIVE

## 2014-07-29 NOTE — Telephone Encounter (Signed)
Attempted to contact patient, no answer, left message with results and plan per Dr. Nehemiah Settle.  Phone number to clinic left if patient has any further questions.

## 2014-07-29 NOTE — Progress Notes (Signed)
Pt states we may leave a message on her voicemail later today.  Dr. Nehemiah Settle reviewed labs ordered follow up ultrasound in 2 wks. Pt to return to clinic for bhcg if bleeding if during clinic hours, to MAU if clinic is closed. Ultrasound scheduled for 10/16 @ 8:45am/Phone call made to patient, detailed message left on voicemail.

## 2014-07-29 NOTE — Progress Notes (Signed)
Spoke with patient while she was in the lab.  Pt denies any pain, bleeding, or cramps.  Pt had a question concerning the next step in the process and informed her that the lab result today would dictate out next step. Pt gave permission for results to be left on her voicemail.  Lab drawn and sent stat.

## 2014-08-09 ENCOUNTER — Ambulatory Visit (HOSPITAL_COMMUNITY): Payer: Self-pay

## 2014-08-09 ENCOUNTER — Ambulatory Visit (HOSPITAL_COMMUNITY)
Admission: RE | Admit: 2014-08-09 | Discharge: 2014-08-09 | Disposition: A | Discharge status: Home / Self Care | Payer: Self-pay | Source: Ambulatory Visit | Attending: Family Medicine | Admitting: Family Medicine

## 2014-08-09 DIAGNOSIS — O208 Other hemorrhage in early pregnancy: Secondary | ICD-10-CM | POA: Insufficient documentation

## 2014-08-09 DIAGNOSIS — N939 Abnormal uterine and vaginal bleeding, unspecified: Secondary | ICD-10-CM

## 2014-08-09 DIAGNOSIS — Z36 Encounter for antenatal screening of mother: Secondary | ICD-10-CM | POA: Insufficient documentation

## 2014-08-09 DIAGNOSIS — Z3A01 Less than 8 weeks gestation of pregnancy: Secondary | ICD-10-CM | POA: Insufficient documentation

## 2014-08-26 ENCOUNTER — Encounter (HOSPITAL_COMMUNITY): Payer: Self-pay | Admitting: *Deleted

## 2014-08-29 ENCOUNTER — Other Ambulatory Visit (HOSPITAL_COMMUNITY): Payer: Self-pay | Admitting: Physician Assistant

## 2014-08-29 DIAGNOSIS — Z3682 Encounter for antenatal screening for nuchal translucency: Secondary | ICD-10-CM

## 2014-08-29 LAB — OB RESULTS CONSOLE GC/CHLAMYDIA
CHLAMYDIA, DNA PROBE: NEGATIVE
Gonorrhea: NEGATIVE

## 2014-08-29 LAB — OB RESULTS CONSOLE ABO/RH: RH TYPE: POSITIVE

## 2014-08-29 LAB — OB RESULTS CONSOLE HIV ANTIBODY (ROUTINE TESTING): HIV: NONREACTIVE

## 2014-08-29 LAB — OB RESULTS CONSOLE RUBELLA ANTIBODY, IGM: Rubella: IMMUNE

## 2014-08-29 LAB — OB RESULTS CONSOLE RPR: RPR: NONREACTIVE

## 2014-08-29 LAB — OB RESULTS CONSOLE HEPATITIS B SURFACE ANTIGEN: HEP B S AG: NEGATIVE

## 2014-08-29 LAB — OB RESULTS CONSOLE ANTIBODY SCREEN: Antibody Screen: NEGATIVE

## 2014-09-17 ENCOUNTER — Ambulatory Visit (HOSPITAL_COMMUNITY): Payer: Self-pay

## 2014-09-17 ENCOUNTER — Ambulatory Visit (HOSPITAL_COMMUNITY)
Admission: RE | Admit: 2014-09-17 | Discharge: 2014-09-17 | Disposition: A | Discharge status: Home / Self Care | Payer: Self-pay | Source: Ambulatory Visit | Attending: Physician Assistant | Admitting: Physician Assistant

## 2014-09-17 ENCOUNTER — Encounter (HOSPITAL_COMMUNITY): Payer: Self-pay

## 2014-09-17 DIAGNOSIS — Z3A13 13 weeks gestation of pregnancy: Secondary | ICD-10-CM | POA: Insufficient documentation

## 2014-09-17 DIAGNOSIS — Z3682 Encounter for antenatal screening for nuchal translucency: Secondary | ICD-10-CM | POA: Insufficient documentation

## 2014-09-17 DIAGNOSIS — Z315 Encounter for genetic counseling: Secondary | ICD-10-CM | POA: Insufficient documentation

## 2014-09-17 DIAGNOSIS — Z8279 Family history of other congenital malformations, deformations and chromosomal abnormalities: Secondary | ICD-10-CM | POA: Insufficient documentation

## 2014-09-17 DIAGNOSIS — Z810 Family history of intellectual disabilities: Secondary | ICD-10-CM | POA: Insufficient documentation

## 2014-09-20 ENCOUNTER — Encounter (HOSPITAL_COMMUNITY): Payer: Self-pay

## 2014-09-20 NOTE — Progress Notes (Signed)
Genetic Counseling  High-Risk Gestation Note  Appointment Date:  09/17/2014 Referred By: Willene Hatchet, PA-C Date of Birth:  [birth year]   Pregnancy History: G3P0020 Estimated Date of Delivery: 03/25/15 Estimated Gestational Age: 69w0dAttending: MRenella Cunas MD   I met with Ms. AWaukomisfor genetic counseling because of Buck family history of Down syndrome. The patient was accompanied by Buck friend to today's visit.   We began by reviewing the family history in detail. Ms. AAlexanderia Gorbyreported that her sister has Trisomy 244(Down syndrome). She reported that this was diagnosed after birth [place] reported that her sister had Buck hole in the heart, which required surgical correction. The patient reported that her sister is currently 175years old. She is the youngest of the siblings, and their mother was 44years old when she was born. The patient described her sister as having some facial features similar to other individuals with Down syndrome. She stated that she is currently in an educational training program that will help her be employed when she completes the program. No additional relatives were reported with Down syndrome.   We discussed chromosomes. We discussed the association with fetal aneuploidy and maternal age of the ova. We discussed that 95% of cases of Down syndrome are not inherited and are the result of non-disjunction.  Three to 4% of cases of Down syndrome are the result of Buck translocation involving chromosome #21.  We discussed the option of chromosome analysis to determine if an individual is Buck carrier of Buck balanced translocation involving chromosome #21.  If an individual carries Buck balanced translocation involving chromosome #21, then the chance to have Buck baby with Down syndrome would be greater than the maternal age-related risk. Given the patient's report that her sister's Down syndrome is due to trisomy 255 the reported  family history would not be expected to increase the risk for Down syndrome above the patient's Buck priori risk (her age related risk) for fetal aneuploidy. We discussed that given her age alone, the current pregnancy is not expected to be at increased risk for fetal aneuploidy.   The family histories were otherwise found to be noncontributory for birth defects, intellectual disability, and known genetic conditions. Without further information regarding the provided family history, an accurate genetic risk cannot be calculated. Further genetic counseling is warranted if more information is obtained.  We reviewed available screening and diagnostic options.  Regarding screening tests, we discussed the options of First screen, Quad screen and ultrasound.  She understands that screening tests are used to modify Buck patient's Buck priori risk for aneuploidy, typically based on age.  This estimate provides Buck pregnancy specific risk assessment.  We also reviewed the availability of diagnostic options including CVS and amniocentesis.  We discussed the risks, limitations, and benefits of each. We discussed that currently, the risk for fetal aneuploidy in the pregnancy would be lower than the associated risk of complications from either CVS or amniocentesis. Buck Buck Amanstated that she would not be interested in invasive, diagnostic testing in the pregnancy. She stated that she is not very concerned about Buck risk for Down syndrome in the pregnancy and stated that given her experience with her sister, she would feel prepared if Down syndrome were to be present.    After reviewing these options, Buck Buck Donnaelected to have first trimester screening today.   Complete ultrasound was performed today. Ultrasound results reported separately.  She understands that ultrasound cannot rule out all birth defects or genetic syndromes. The patient was advised of this limitation and states she still does not want diagnostic testing at  this time.   Buck Buck Buck denied exposure to environmental toxins or chemical agents. She denied the use of alcohol, tobacco or street drugs. She denied significant viral illnesses during the course of her pregnancy. Her medical and surgical histories were contributory for two previous early first trimester miscarriages: one with the current partner and one with Buck previous partner.   I counseled Buck Buck Buck regarding the above risks and available options.  The approximate face-to-face time with the genetic counselor was 30 minutes.  Chipper Oman, MS Certified Genetic Counselor 09/20/2014

## 2014-09-25 ENCOUNTER — Other Ambulatory Visit (HOSPITAL_COMMUNITY): Payer: Self-pay

## 2014-09-26 ENCOUNTER — Other Ambulatory Visit (HOSPITAL_COMMUNITY): Payer: Self-pay | Admitting: Urology

## 2014-09-26 DIAGNOSIS — Z3689 Encounter for other specified antenatal screening: Secondary | ICD-10-CM

## 2014-10-10 ENCOUNTER — Encounter (HOSPITAL_COMMUNITY): Payer: Self-pay

## 2014-10-22 ENCOUNTER — Other Ambulatory Visit (HOSPITAL_COMMUNITY): Payer: Self-pay | Admitting: Urology

## 2014-10-22 ENCOUNTER — Ambulatory Visit (HOSPITAL_COMMUNITY)
Admission: RE | Admit: 2014-10-22 | Discharge: 2014-10-22 | Disposition: A | Discharge status: Home / Self Care | Payer: Medicaid Other | Source: Ambulatory Visit | Attending: Urology | Admitting: Urology

## 2014-10-22 DIAGNOSIS — Z3689 Encounter for other specified antenatal screening: Secondary | ICD-10-CM

## 2014-10-22 DIAGNOSIS — O358XX0 Maternal care for other (suspected) fetal abnormality and damage, not applicable or unspecified: Secondary | ICD-10-CM | POA: Insufficient documentation

## 2014-10-22 DIAGNOSIS — Z3A18 18 weeks gestation of pregnancy: Secondary | ICD-10-CM | POA: Diagnosis not present

## 2014-10-22 DIAGNOSIS — Z36 Encounter for antenatal screening of mother: Secondary | ICD-10-CM | POA: Diagnosis not present

## 2014-10-23 ENCOUNTER — Other Ambulatory Visit (HOSPITAL_COMMUNITY): Payer: Self-pay | Admitting: Urology

## 2014-10-23 DIAGNOSIS — Z3689 Encounter for other specified antenatal screening: Secondary | ICD-10-CM | POA: Insufficient documentation

## 2014-10-23 DIAGNOSIS — O359XX1 Maternal care for (suspected) fetal abnormality and damage, unspecified, fetus 1: Secondary | ICD-10-CM

## 2014-10-23 DIAGNOSIS — Z3A18 18 weeks gestation of pregnancy: Secondary | ICD-10-CM | POA: Insufficient documentation

## 2014-10-23 DIAGNOSIS — O358XX0 Maternal care for other (suspected) fetal abnormality and damage, not applicable or unspecified: Secondary | ICD-10-CM | POA: Insufficient documentation

## 2014-10-23 DIAGNOSIS — IMO0002 Reserved for concepts with insufficient information to code with codable children: Secondary | ICD-10-CM | POA: Insufficient documentation

## 2014-10-24 ENCOUNTER — Telehealth (HOSPITAL_COMMUNITY): Payer: Self-pay | Admitting: *Deleted

## 2014-10-24 LAB — CMV IGM: CMV IgM: <8 AU/mL (ref <30.00)

## 2014-10-24 LAB — TOXOPLASMA ANTIBODIES- IGG AND  IGM: Toxoplasma IgG Ratio: <3 IU/mL (ref <7.2)

## 2014-10-24 LAB — CMV ANTIBODY, IGG (EIA): CMV Ab - IgG: 3.5 U/mL — ABNORMAL HIGH (ref <0.60)

## 2014-10-24 NOTE — Telephone Encounter (Signed)
Attempted to call pt regarding CMV and Toxo lab results, unable to leave message.  Will retry to call.

## 2014-10-24 NOTE — Telephone Encounter (Signed)
Notified pt of CMV and toxo negative lab results. Panorama results pending.

## 2014-10-25 NOTE — L&D Delivery Note (Cosign Needed)
Patient is 28 y.o. G3P0020 [redacted]w[redacted]d admitted SROM, hx of HSV-2.  Delivery Note At 2:56 AM a viable female was delivered via Vaginal, Spontaneous Delivery (Presentation:  Occiput Anterior).  APGAR: 7, 9; weight 3595g.  Placenta status: Intact, Spontaneous.  Cord: 3 vessels with the following complications: None.  Anesthesia: Epidural  Episiotomy: None Lacerations:  1st degree perineal - repaired with figure 8 suture Suture Repair: 3.0 vicryl Est. Blood Loss (mL):  199  Upon arrival patient was complete and had been pushing for 2 hrs. She pushed with fair maternal effort. Dr. Elonda Husky was called to beside to consider vacuum assisted delivery due to poor maternal effort and exhaustion. She eventually delivered a healthy baby girl without assistance. Baby delivered without difficulty, was noted to have good tone and place on maternal abdomen for oral suctioning, drying and stimulation. Delayed cord clamping performed. Placenta delivered intact with 3V cord. Vaginal canal and perineum was inspected and only with 1st degree perineal laceration. Figure of 8 suture used to make site hemostatic. Pitocin was started and uterus massaged until bleeding slowed. Counts of sharps, instruments, and lap pads were all correct.   Mom to postpartum.  Baby to Couplet care / Skin to Skin.  Luiz Blare, DO 04/03/2015, 3:23 AM PGY-1, [facility]  Birth and repair supervised by me.  Roma Schanz, CNM, [facility] 04/07/2015 8:33 AM

## 2014-10-31 ENCOUNTER — Other Ambulatory Visit (HOSPITAL_COMMUNITY): Payer: Self-pay

## 2014-10-31 ENCOUNTER — Telehealth (HOSPITAL_COMMUNITY): Payer: Self-pay | Admitting: MS"

## 2014-10-31 NOTE — Telephone Encounter (Signed)
Left message for patient to return call.   [name] 10/31/2014 10:24 AM

## 2014-10-31 NOTE — Telephone Encounter (Signed)
[name] to discuss her cell free fetal DNA test results.  [name] had Panorama testing through [facility].  Testing was offered because of ultrasound finding of fetal echogenic bowel.   The patient was identified by name and DOB.  We reviewed that these are within normal limits, showing a less than 1 in 10,000 risk for trisomies 21, 18 and 13, and monosomy X (Turner syndrome).  In addition, the risk for triploidy/vanishing twin and sex chromosome trisomies (47,XXX and 47,XXY) was also low risk.  We reviewed that this testing identifies > 99% of pregnancies with trisomy 6, trisomy 48, sex chromosome trisomies (47,XXX and 47,XXY), and triploidy. The detection rate for trisomy 18 is 96%.  The detection rate for monosomy X is ~92%.  The false positive rate is <0.1% for all conditions. Testing was also consistent with female fetal sex. She understands that this testing does not identify all genetic conditions.  All questions were answered to her satisfaction, she was encouraged to call with additional questions or concerns.  Chipper Oman, MS Certified Genetic Counselor 10/31/2014 10:40 AM

## 2014-11-12 ENCOUNTER — Ambulatory Visit (HOSPITAL_COMMUNITY)
Admission: RE | Admit: 2014-11-12 | Discharge: 2014-11-12 | Disposition: A | Discharge status: Home / Self Care | Payer: Self-pay | Source: Ambulatory Visit | Attending: Physician Assistant | Admitting: Physician Assistant

## 2014-11-12 ENCOUNTER — Other Ambulatory Visit (HOSPITAL_COMMUNITY): Payer: Self-pay | Admitting: Maternal and Fetal Medicine

## 2014-11-12 DIAGNOSIS — IMO0002 Reserved for concepts with insufficient information to code with codable children: Secondary | ICD-10-CM | POA: Insufficient documentation

## 2014-11-12 DIAGNOSIS — Z36 Encounter for antenatal screening of mother: Secondary | ICD-10-CM | POA: Insufficient documentation

## 2014-11-12 DIAGNOSIS — O359XX1 Maternal care for (suspected) fetal abnormality and damage, unspecified, fetus 1: Secondary | ICD-10-CM

## 2014-11-12 DIAGNOSIS — Z0489 Encounter for examination and observation for other specified reasons: Secondary | ICD-10-CM | POA: Insufficient documentation

## 2014-11-12 DIAGNOSIS — Z3A21 21 weeks gestation of pregnancy: Secondary | ICD-10-CM | POA: Insufficient documentation

## 2014-11-12 DIAGNOSIS — O358XX0 Maternal care for other (suspected) fetal abnormality and damage, not applicable or unspecified: Secondary | ICD-10-CM | POA: Insufficient documentation

## 2014-12-24 ENCOUNTER — Ambulatory Visit (HOSPITAL_COMMUNITY)
Admission: RE | Admit: 2014-12-24 | Discharge: 2014-12-24 | Disposition: A | Discharge status: Home / Self Care | Payer: Medicaid Other | Source: Ambulatory Visit | Attending: Physician Assistant | Admitting: Physician Assistant

## 2014-12-24 ENCOUNTER — Encounter (HOSPITAL_COMMUNITY): Payer: Self-pay

## 2014-12-24 DIAGNOSIS — O359XX1 Maternal care for (suspected) fetal abnormality and damage, unspecified, fetus 1: Secondary | ICD-10-CM

## 2014-12-24 DIAGNOSIS — O35DXX0 Maternal care for other (suspected) fetal abnormality and damage, fetal gastrointestinal anomalies, not applicable or unspecified: Secondary | ICD-10-CM | POA: Insufficient documentation

## 2014-12-24 DIAGNOSIS — O358XX0 Maternal care for other (suspected) fetal abnormality and damage, not applicable or unspecified: Secondary | ICD-10-CM | POA: Insufficient documentation

## 2014-12-24 DIAGNOSIS — Z3A27 27 weeks gestation of pregnancy: Secondary | ICD-10-CM | POA: Insufficient documentation

## 2015-03-06 LAB — OB RESULTS CONSOLE GBS: STREP GROUP B AG: POSITIVE

## 2015-03-27 ENCOUNTER — Other Ambulatory Visit (HOSPITAL_COMMUNITY): Payer: Self-pay | Admitting: Nurse Practitioner

## 2015-03-27 DIAGNOSIS — O480 Post-term pregnancy: Secondary | ICD-10-CM

## 2015-03-28 ENCOUNTER — Ambulatory Visit (HOSPITAL_COMMUNITY)
Admission: RE | Admit: 2015-03-28 | Discharge: 2015-03-28 | Disposition: A | Discharge status: Home / Self Care | Payer: Medicaid Other | Source: Ambulatory Visit | Attending: Nurse Practitioner | Admitting: Nurse Practitioner

## 2015-03-28 DIAGNOSIS — O480 Post-term pregnancy: Secondary | ICD-10-CM | POA: Insufficient documentation

## 2015-03-28 DIAGNOSIS — Z3A40 40 weeks gestation of pregnancy: Secondary | ICD-10-CM | POA: Insufficient documentation

## 2015-04-01 ENCOUNTER — Inpatient Hospital Stay (HOSPITAL_COMMUNITY)
Admission: AD | Admit: 2015-04-01 | Discharge: 2015-04-01 | Disposition: A | Discharge status: Home / Self Care | Payer: Medicaid Other | Source: Ambulatory Visit | Attending: Obstetrics & Gynecology | Admitting: Obstetrics & Gynecology

## 2015-04-01 ENCOUNTER — Encounter (HOSPITAL_COMMUNITY): Payer: Self-pay | Admitting: *Deleted

## 2015-04-01 ENCOUNTER — Telehealth (HOSPITAL_COMMUNITY): Payer: Self-pay | Admitting: *Deleted

## 2015-04-01 DIAGNOSIS — O471 False labor at or after 37 completed weeks of gestation: Secondary | ICD-10-CM | POA: Insufficient documentation

## 2015-04-01 DIAGNOSIS — O9989 Other specified diseases and conditions complicating pregnancy, childbirth and the puerperium: Secondary | ICD-10-CM | POA: Insufficient documentation

## 2015-04-01 DIAGNOSIS — Z3A41 41 weeks gestation of pregnancy: Secondary | ICD-10-CM | POA: Insufficient documentation

## 2015-04-01 DIAGNOSIS — Z8279 Family history of other congenital malformations, deformations and chromosomal abnormalities: Secondary | ICD-10-CM

## 2015-04-01 HISTORY — DX: Anogenital herpesviral infection, unspecified: A60.9

## 2015-04-01 NOTE — Discharge Instructions (Signed)

## 2015-04-01 NOTE — MAU Note (Signed)
C/o ucs since 0400 this AM; denies any vaginal bleeding or leaking;

## 2015-04-01 NOTE — MAU Note (Signed)
PT SAYS SHE LOST MUCUS PLUG    AT 6PM.  [facility]   HD-     INDUCTION [facility]  FOR  6-9.     WAS IN MAU  THIS  AM-   HAS CONTINUED  TO HAVE  SOME UC.  VE -  1  CM.   HAS HX  OF  HSV-  LAST OUTBREAK  - April.  IS  TAKING  VALTREX  .  DENIES    MRSA      .  GBS- POSITIVE

## 2015-04-01 NOTE — Discharge Instructions (Signed)
Braxton Hicks Contractions °Contractions of the uterus can occur throughout pregnancy. Contractions are not always a sign that you are in labor.  °WHAT ARE BRAXTON HICKS CONTRACTIONS?  °Contractions that occur before labor are called Braxton Hicks contractions, or false labor. Toward the end of pregnancy (32-34 weeks), these contractions can develop more often and may become more forceful. This is not true labor because these contractions do not result in opening (dilatation) and thinning of the cervix. They are sometimes difficult to tell apart from true labor because these contractions can be forceful and people have different pain tolerances. You should not feel embarrassed if you go to the hospital with false labor. Sometimes, the only way to tell if you are in true labor is for your health care provider to look for changes in the cervix. °If there are no prenatal problems or other health problems associated with the pregnancy, it is completely safe to be sent home with false labor and await the onset of true labor. °HOW CAN YOU TELL THE DIFFERENCE BETWEEN TRUE AND FALSE LABOR? °False Labor °· The contractions of false labor are usually shorter and not as hard as those of true labor.   °· The contractions are usually irregular.   °· The contractions are often felt in the front of the lower abdomen and in the groin.   °· The contractions may go away when you walk around or change positions while lying down.   °· The contractions get weaker and are shorter lasting as time goes on.   °· The contractions do not usually become progressively stronger, regular, and closer together as with true labor.   °True Labor °1. Contractions in true labor last 30-70 seconds, become very regular, usually become more intense, and increase in frequency.   °2. The contractions do not go away with walking.   °3. The discomfort is usually felt in the top of the uterus and spreads to the lower abdomen and low back.   °4. True labor can  be determined by your health care provider with an exam. This will show that the cervix is dilating and getting thinner.   °WHAT TO REMEMBER °· Keep up with your usual exercises and follow other instructions given by your health care provider.   °· Take medicines as directed by your health care provider.   °· Keep your regular prenatal appointments.   °· Eat and drink lightly if you think you are going into labor.   °· If Braxton Hicks contractions are making you uncomfortable:   °· Change your position from lying down or resting to walking, or from walking to resting.   °· Sit and rest in a tub of warm water.   °· Drink 2-3 glasses of water. Dehydration may cause these contractions.   °· Do slow and deep breathing several times an hour.   °WHEN SHOULD I SEEK IMMEDIATE MEDICAL CARE? °Seek immediate medical care if: °· Your contractions become stronger, more regular, and closer together.   °· You have fluid leaking or gushing from your vagina.   °· You have a fever.   °· You pass blood-tinged mucus.   °· You have vaginal bleeding.   °· You have continuous abdominal pain.   °· You have low back pain that you never had before.   °· You feel your baby's head pushing down and causing pelvic pressure.   °· Your baby is not moving as much as it used to.   °Document Released: 10/11/2005 Document Revised: 10/16/2013 Document Reviewed: 07/23/2013 °ExitCare® Patient Information ©2015 [employer]. This information is not intended to replace advice given to you by your health care   provider. Make sure you discuss any questions you have with your health care provider. ° °Fetal Movement Counts °Patient Name: __________________________________________________ Patient Due Date: ____________________ °Performing a fetal movement count is highly recommended in high-risk pregnancies, but it is good for every pregnant woman to do. Your health care provider may ask you to start counting fetal movements at 28 weeks of the pregnancy. Fetal  movements often increase: °· After eating a full meal. °· After physical activity. °· After eating or drinking something sweet or cold. °· At rest. °Pay attention to when you feel the baby is most active. This will help you notice a pattern of your baby's sleep and wake cycles and what factors contribute to an increase in fetal movement. It is important to perform a fetal movement count at the same time each day when your baby is normally most active.  °HOW TO COUNT FETAL MOVEMENTS °5. Find a quiet and comfortable area to sit or lie down on your left side. Lying on your left side provides the best blood and oxygen circulation to your baby. °6. Write down the day and time on a sheet of paper or in a journal. °7. Start counting kicks, flutters, swishes, rolls, or jabs in a 2-hour period. You should feel at least 10 movements within 2 hours. °8. If you do not feel 10 movements in 2 hours, wait 2-3 hours and count again. Look for a change in the pattern or not enough counts in 2 hours. °SEEK MEDICAL CARE IF: °· You feel less than 10 counts in 2 hours, tried twice. °· There is no movement in over an hour. °· The pattern is changing or taking longer each day to reach 10 counts in 2 hours. °· You feel the baby is not moving as he or she usually does. °Date: ____________ Movements: ____________ Start time: ____________ Finish time: ____________  °Date: ____________ Movements: ____________ Start time: ____________ Finish time: ____________ °Date: ____________ Movements: ____________ Start time: ____________ Finish time: ____________ °Date: ____________ Movements: ____________ Start time: ____________ Finish time: ____________ °Date: ____________ Movements: ____________ Start time: ____________ Finish time: ____________ °Date: ____________ Movements: ____________ Start time: ____________ Finish time: ____________ °Date: ____________ Movements: ____________ Start time: ____________ Finish time: ____________ °Date: ____________  Movements: ____________ Start time: ____________ Finish time: ____________  °Date: ____________ Movements: ____________ Start time: ____________ Finish time: ____________ °Date: ____________ Movements: ____________ Start time: ____________ Finish time: ____________ °Date: ____________ Movements: ____________ Start time: ____________ Finish time: ____________ °Date: ____________ Movements: ____________ Start time: ____________ Finish time: ____________ °Date: ____________ Movements: ____________ Start time: ____________ Finish time: ____________ °Date: ____________ Movements: ____________ Start time: ____________ Finish time: ____________ °Date: ____________ Movements: ____________ Start time: ____________ Finish time: ____________  °Date: ____________ Movements: ____________ Start time: ____________ Finish time: ____________ °Date: ____________ Movements: ____________ Start time: ____________ Finish time: ____________ °Date: ____________ Movements: ____________ Start time: ____________ Finish time: ____________ °Date: ____________ Movements: ____________ Start time: ____________ Finish time: ____________ °Date: ____________ Movements: ____________ Start time: ____________ Finish time: ____________ °Date: ____________ Movements: ____________ Start time: ____________ Finish time: ____________ °Date: ____________ Movements: ____________ Start time: ____________ Finish time: ____________  °Date: ____________ Movements: ____________ Start time: ____________ Finish time: ____________ °Date: ____________ Movements: ____________ Start time: ____________ Finish time: ____________ °Date: ____________ Movements: ____________ Start time: ____________ Finish time: ____________ °Date: ____________ Movements: ____________ Start time: ____________ Finish time: ____________ °Date: ____________ Movements: ____________ Start time: ____________ Finish time: ____________ °Date: ____________ Movements: ____________ Start time:  ____________ Finish time: ____________ °Date: ____________ Movements:   ____________ Start time: ____________ Finish time: ____________  °Date: ____________ Movements: ____________ Start time: ____________ Finish time: ____________ °Date: ____________ Movements: ____________ Start time: ____________ Finish time: ____________ °Date: ____________ Movements: ____________ Start time: ____________ Finish time: ____________ °Date: ____________ Movements: ____________ Start time: ____________ Finish time: ____________ °Date: ____________ Movements: ____________ Start time: ____________ Finish time: ____________ °Date: ____________ Movements: ____________ Start time: ____________ Finish time: ____________ °Date: ____________ Movements: ____________ Start time: ____________ Finish time: ____________  °Date: ____________ Movements: ____________ Start time: ____________ Finish time: ____________ °Date: ____________ Movements: ____________ Start time: ____________ Finish time: ____________ °Date: ____________ Movements: ____________ Start time: ____________ Finish time: ____________ °Date: ____________ Movements: ____________ Start time: ____________ Finish time: ____________ °Date: ____________ Movements: ____________ Start time: ____________ Finish time: ____________ °Date: ____________ Movements: ____________ Start time: ____________ Finish time: ____________ °Date: ____________ Movements: ____________ Start time: ____________ Finish time: ____________  °Date: ____________ Movements: ____________ Start time: ____________ Finish time: ____________ °Date: ____________ Movements: ____________ Start time: ____________ Finish time: ____________ °Date: ____________ Movements: ____________ Start time: ____________ Finish time: ____________ °Date: ____________ Movements: ____________ Start time: ____________ Finish time: ____________ °Date: ____________ Movements: ____________ Start time: ____________ Finish time: ____________ °Date:  ____________ Movements: ____________ Start time: ____________ Finish time: ____________ °Date: ____________ Movements: ____________ Start time: ____________ Finish time: ____________  °Date: ____________ Movements: ____________ Start time: ____________ Finish time: ____________ °Date: ____________ Movements: ____________ Start time: ____________ Finish time: ____________ °Date: ____________ Movements: ____________ Start time: ____________ Finish time: ____________ °Date: ____________ Movements: ____________ Start time: ____________ Finish time: ____________ °Date: ____________ Movements: ____________ Start time: ____________ Finish time: ____________ °Date: ____________ Movements: ____________ Start time: ____________ Finish time: ____________ °Document Released: 11/10/2006 Document Revised: 02/25/2014 Document Reviewed: 08/07/2012 °ExitCare® Patient Information ©2015 [employer]. This information is not intended to replace advice given to you by your health care provider. Make sure you discuss any questions you have with your health care provider. ° °

## 2015-04-01 NOTE — MAU Provider Note (Signed)
Chief Complaint:  Contractions   First Provider Initiated Contact with Patient 04/01/15 2318     HPI: Monica Buck is a 29 y.o. G3P0020 at [redacted]w[redacted]d by LMP verified by 7 week [place] who presents to maternity admissions reporting losing mucus plug. Continues to have irreg contractions every 5-10 minutes. VE 1 cm this morning in MAU. Denies leakage of fluid or vaginal bleeding. Good fetal movement.   Pregnancy Course: Complicated by echogenic bowel on [place], resolved. Normal NIPS, quad.   Past Medical History: Past Medical History  Diagnosis Date  . HSV (herpes simplex virus) anogenital infection     Past obstetric history: OB History  Gravida Para Term Preterm AB SAB TAB Ectopic Multiple Living  3    2 2     0    # Outcome Date GA Lbr Len/2nd Weight Sex Delivery Anes PTL Lv  3 Current           2 SAB              Comments: System Generated. Please review and update pregnancy details.  1 SAB               Past Surgical History: Past Surgical History  Procedure Laterality Date  . No past surgeries       Family History: Family History  Problem Relation Age of Onset  . Cancer Father   . Down syndrome Sister     Social History: History  Substance Use Topics  . Smoking status: Never Smoker   . Smokeless tobacco: Never Used  . Alcohol Use: Yes     Comment: occasional drink    Allergies:  Allergies  Allergen Reactions  . Pork-Derived Products Rash    Meds:  Prescriptions prior to admission  Medication Sig Dispense Refill Last Dose  . Prenatal Multivit-Min-Fe-FA (PRENATAL VITAMINS PO) Take 1 tablet by mouth daily.    04/01/2015 at Unknown time  . valACYclovir (VALTREX) 1000 MG tablet Take 500 mg by mouth 2 (two) times daily.   04/01/2015 at Unknown time  . acetaminophen (TYLENOL) 500 MG tablet Take 1,000 mg by mouth every 6 (six) hours as needed for moderate pain.   Not Taking at Unknown time    ROS:  Review of Systems  Constitutional: Negative for fever and chills.   Gastrointestinal: Positive for abdominal pain (contractions only).  Genitourinary:       Pos for passing mucus plug. Neg for HSV lesions, VB or LOF    Physical Exam  Blood pressure 121/63, pulse 82, temperature 98.5 F (36.9 C), temperature source Oral, resp. rate 20, height 5\' 2"  (1.575 m), last menstrual period 06/18/2014, unknown if currently breastfeeding. GENERAL: Well-developed, well-nourished female in mild distress w/ UC's.  HEART: normal rate RESP: normal effort GI: Abd soft, non-tender, gravid appropriate for gestational age.  MS: Extremities nontender, no edema, normal ROM NEURO: Alert and oriented x 4.  GU: NEFG, physiologic discharge, no blood.  Dilation: 1 Effacement (%): 70 Station: -3 Presentation: Vertex Exam by:: Dvid Pendry, CNM  FHT:  Baseline 120 , moderate variability, accelerations present, no decelerations Contractions: irreg, mild-mod   Labs: No results found for this or any previous visit (from the past 24 hour(s)).  Imaging:    MAU Course: NST reactive. Reviewed BPP 03/28/15. 8/8  Assessment: 1. False labor at or after 37 completed weeks of gestation, antepartum    Plan: Discharge home in stable condition.  Labor precautions and fetal kick counts Follow-up Information    Follow  up with [facility] On 04/03/2015.   Why:  Induction of labor at 6:15 am   Contact information:   [address] [id number] [place]      Follow up with [facility].   Why:  Broken water, Stronger and closer contractions, vaginal bleeding or decrease fetal movement.    Contact information:   [address] [id number] [place] [phone]        Medication List    TAKE these medications        acetaminophen 500 MG tablet  Commonly known as:  TYLENOL  Take 1,000 mg by mouth every 6 (six) hours as  needed for moderate pain.     PRENATAL VITAMINS PO  Take 1 tablet by mouth daily.     valACYclovir 1000 MG tablet  Commonly known as:  VALTREX  Take 500 mg by mouth 2 (two) times daily.        [place], CNM 04/01/2015 11:18 PM

## 2015-04-01 NOTE — Telephone Encounter (Signed)
Preadmission screen  

## 2015-04-01 NOTE — MAU Note (Signed)
Scheduled for induction on Thursday (04-03-15);

## 2015-04-02 ENCOUNTER — Encounter (HOSPITAL_COMMUNITY): Payer: Self-pay

## 2015-04-02 ENCOUNTER — Inpatient Hospital Stay (HOSPITAL_COMMUNITY): Payer: Medicaid Other | Admitting: Anesthesiology

## 2015-04-02 ENCOUNTER — Inpatient Hospital Stay (HOSPITAL_COMMUNITY)
Admission: AD | Admit: 2015-04-02 | Discharge: 2015-04-05 | DRG: 774 | Disposition: A | Discharge status: Home / Self Care | Payer: Medicaid Other | Source: Ambulatory Visit | Attending: Obstetrics and Gynecology | Admitting: Obstetrics and Gynecology

## 2015-04-02 DIAGNOSIS — O99824 Streptococcus B carrier state complicating childbirth: Secondary | ICD-10-CM | POA: Diagnosis present

## 2015-04-02 DIAGNOSIS — Z3A41 41 weeks gestation of pregnancy: Secondary | ICD-10-CM | POA: Diagnosis present

## 2015-04-02 DIAGNOSIS — IMO0001 Reserved for inherently not codable concepts without codable children: Secondary | ICD-10-CM

## 2015-04-02 DIAGNOSIS — A6000 Herpesviral infection of urogenital system, unspecified: Secondary | ICD-10-CM | POA: Diagnosis present

## 2015-04-02 DIAGNOSIS — O9832 Other infections with a predominantly sexual mode of transmission complicating childbirth: Secondary | ICD-10-CM | POA: Diagnosis present

## 2015-04-02 DIAGNOSIS — Z8279 Family history of other congenital malformations, deformations and chromosomal abnormalities: Secondary | ICD-10-CM

## 2015-04-02 HISTORY — DX: Thrombocytopenia, unspecified: D69.6

## 2015-04-02 HISTORY — DX: Depression, unspecified: F32.A

## 2015-04-02 HISTORY — DX: Major depressive disorder, single episode, unspecified: F32.9

## 2015-04-02 LAB — TYPE AND SCREEN
ABO/RH(D): O POS
ANTIBODY SCREEN: NEGATIVE

## 2015-04-02 LAB — CBC
HCT: 34.5 % — ABNORMAL LOW (ref 36.0–46.0)
Hemoglobin: 11.9 g/dL — ABNORMAL LOW (ref 12.0–15.0)
MCH: 31.9 pg (ref 26.0–34.0)
MCHC: 34.5 g/dL (ref 30.0–36.0)
MCV: 92.5 fL (ref 78.0–100.0)
Platelets: 143 10*3/uL — ABNORMAL LOW (ref 150–400)
RBC: 3.73 MIL/uL — ABNORMAL LOW (ref 3.87–5.11)
RDW: 14.1 % (ref 11.5–15.5)
WBC: 9.4 10*3/uL (ref 4.0–10.5)

## 2015-04-02 LAB — POCT PREGNANCY, URINE: Preg Test, Ur: POSITIVE — AB

## 2015-04-02 MED ORDER — CITRIC ACID-SODIUM CITRATE 334-500 MG/5ML PO SOLN: 30.0000 mL | ORAL | Status: DC | PRN

## 2015-04-02 MED ORDER — FENTANYL 2.5 MCG/ML BUPIVACAINE 1/10 % EPIDURAL INFUSION (WH - ANES)
14.0000 mL/h | INTRAMUSCULAR | Status: DC | PRN
  Administered 2015-04-02 (×3): 14 mL/h via EPIDURAL
  Filled 2015-04-02 (×2): qty 125

## 2015-04-02 MED ORDER — LACTATED RINGERS IV SOLN
INTRAVENOUS | Status: DC
  Administered 2015-04-02 – 2015-04-03 (×4): via INTRAVENOUS

## 2015-04-02 MED ORDER — BUTORPHANOL TARTRATE 1 MG/ML IJ SOLN: 1.0000 mg | INTRAMUSCULAR | Status: DC | PRN

## 2015-04-02 MED ORDER — PHENYLEPHRINE 40 MCG/ML (10ML) SYRINGE FOR IV PUSH (FOR BLOOD PRESSURE SUPPORT)
80.0000 ug | PREFILLED_SYRINGE | INTRAVENOUS | Status: DC | PRN
  Filled 2015-04-02: qty 2
  Filled 2015-04-02: qty 20

## 2015-04-02 MED ORDER — BUTORPHANOL TARTRATE 1 MG/ML IJ SOLN
1.0000 mg | INTRAMUSCULAR | Status: DC | PRN
  Administered 2015-04-02: 1 mg via INTRAMUSCULAR
  Filled 2015-04-02: qty 1

## 2015-04-02 MED ORDER — DIPHENHYDRAMINE HCL 50 MG/ML IJ SOLN: 12.5000 mg | INTRAMUSCULAR | Status: DC | PRN

## 2015-04-02 MED ORDER — PENICILLIN G POTASSIUM 5000000 UNITS IJ SOLR
5.0000 10*6.[IU] | Freq: Once | INTRAVENOUS | Status: AC
  Administered 2015-04-02: 5 10*6.[IU] via INTRAVENOUS
  Filled 2015-04-02: qty 5

## 2015-04-02 MED ORDER — OXYCODONE-ACETAMINOPHEN 5-325 MG PO TABS: 1.0000 | ORAL_TABLET | ORAL | Status: DC | PRN

## 2015-04-02 MED ORDER — ONDANSETRON HCL 4 MG/2ML IJ SOLN: 4.0000 mg | Freq: Four times a day (QID) | INTRAMUSCULAR | Status: DC | PRN

## 2015-04-02 MED ORDER — OXYTOCIN BOLUS FROM INFUSION
500.0000 mL | INTRAVENOUS | Status: DC
  Administered 2015-04-03: 500 mL via INTRAVENOUS

## 2015-04-02 MED ORDER — BUTORPHANOL TARTRATE 1 MG/ML IJ SOLN
1.0000 mg | INTRAMUSCULAR | Status: DC | PRN
  Administered 2015-04-02: 1 mg via INTRAVENOUS
  Filled 2015-04-02: qty 1

## 2015-04-02 MED ORDER — PENICILLIN G POTASSIUM 5000000 UNITS IJ SOLR
2.5000 10*6.[IU] | INTRAVENOUS | Status: DC
  Administered 2015-04-02 (×3): 2.5 10*6.[IU] via INTRAVENOUS
  Filled 2015-04-02 (×7): qty 2.5

## 2015-04-02 MED ORDER — OXYCODONE-ACETAMINOPHEN 5-325 MG PO TABS: 2.0000 | ORAL_TABLET | ORAL | Status: DC | PRN

## 2015-04-02 MED ORDER — LIDOCAINE HCL (PF) 1 % IJ SOLN
INTRAMUSCULAR | Status: DC | PRN
  Administered 2015-04-02 (×2): 8 mL

## 2015-04-02 MED ORDER — TERBUTALINE SULFATE 1 MG/ML IJ SOLN: 0.2500 mg | Freq: Once | INTRAMUSCULAR | Status: AC | PRN

## 2015-04-02 MED ORDER — LACTATED RINGERS IV SOLN
500.0000 mL | INTRAVENOUS | Status: DC | PRN
  Administered 2015-04-02: 500 mL via INTRAVENOUS

## 2015-04-02 MED ORDER — EPHEDRINE 5 MG/ML INJ
10.0000 mg | INTRAVENOUS | Status: DC | PRN
  Filled 2015-04-02: qty 2

## 2015-04-02 MED ORDER — ACETAMINOPHEN 325 MG PO TABS: 650.0000 mg | ORAL_TABLET | ORAL | Status: DC | PRN

## 2015-04-02 MED ORDER — OXYTOCIN 40 UNITS IN LACTATED RINGERS INFUSION - SIMPLE MED: 62.5000 mL/h | INTRAVENOUS | Status: DC

## 2015-04-02 MED ORDER — PROMETHAZINE HCL 25 MG/ML IJ SOLN
12.5000 mg | Freq: Once | INTRAMUSCULAR | Status: AC
  Administered 2015-04-02: 12.5 mg via INTRAMUSCULAR
  Filled 2015-04-02: qty 1

## 2015-04-02 MED ORDER — LIDOCAINE HCL (PF) 1 % IJ SOLN
30.0000 mL | INTRAMUSCULAR | Status: AC | PRN
  Administered 2015-04-03: 30 mL via SUBCUTANEOUS
  Filled 2015-04-02: qty 30

## 2015-04-02 MED ORDER — OXYTOCIN 40 UNITS IN LACTATED RINGERS INFUSION - SIMPLE MED
1.0000 m[IU]/min | INTRAVENOUS | Status: DC
  Administered 2015-04-02: 2 m[IU]/min via INTRAVENOUS
  Filled 2015-04-02: qty 1000

## 2015-04-02 NOTE — Progress Notes (Signed)
Report was called to [facility] Charge by [employer].

## 2015-04-02 NOTE — Anesthesia Preprocedure Evaluation (Signed)
Anesthesia Evaluation  Patient identified by MRN, date of birth, ID band Patient awake    Reviewed: Allergy & Precautions, H&P , NPO status , Patient's Chart, lab work & pertinent test results  Airway Mallampati: II  TM Distance: >3 FB Neck ROM: full    Dental no notable dental hx.    Pulmonary neg pulmonary ROS,    Pulmonary exam normal       Cardiovascular negative cardio ROS Normal cardiovascular exam    Neuro/Psych negative neurological ROS     GI/Hepatic negative GI ROS, Neg liver ROS,   Endo/Other  negative endocrine ROS  Renal/GU negative Renal ROS     Musculoskeletal   Abdominal (+) + obese,   Peds  Hematology negative hematology ROS (+)   Anesthesia Other Findings   Reproductive/Obstetrics (+) Pregnancy                             Anesthesia Physical Anesthesia Plan  ASA: II  Anesthesia Plan: Epidural   Post-op Pain Management:    Induction:   Airway Management Planned:   Additional Equipment:   Intra-op Plan:   Post-operative Plan:   Informed Consent: I have reviewed the patients History and Physical, chart, labs and discussed the procedure including the risks, benefits and alternatives for the proposed anesthesia with the patient or authorized representative who has indicated his/her understanding and acceptance.     Plan Discussed with:   Anesthesia Plan Comments:         Anesthesia Quick Evaluation

## 2015-04-02 NOTE — Anesthesia Procedure Notes (Signed)
Epidural Patient location during procedure: OB Start time: 04/02/2015 4:04 PM End time: 04/02/2015 4:08 PM  Staffing Anesthesiologist: Lyn Hollingshead Performed by: anesthesiologist   Preanesthetic Checklist Completed: patient identified, surgical consent, pre-op evaluation, timeout performed, IV checked, risks and benefits discussed and monitors and equipment checked  Epidural Patient position: sitting Prep: site prepped and draped and DuraPrep Patient monitoring: continuous pulse ox and blood pressure Approach: midline Location: L3-L4 Injection technique: LOR air  Needle:  Needle type: Tuohy  Needle gauge: 17 G Needle length: 9 cm and 9 Needle insertion depth: 5 cm cm Catheter type: closed end flexible Catheter size: 19 Gauge Catheter at skin depth: 10 cm Test dose: negative and Other  Assessment Sensory level: T9 Events: blood not aspirated, injection not painful, no injection resistance, negative IV test and no paresthesia  Additional Notes Reason for block:procedure for pain

## 2015-04-02 NOTE — MAU Note (Signed)
Patient states water broke about one hour ago clear fluid still leaking having contractions every 5 minutes was in MAU yesterday was 1 cm, no problems during the pregnancy.

## 2015-04-02 NOTE — Progress Notes (Signed)
Labor Progress Note  S: Patient is feeling uncomfortable contractions. Otherwise well.  O:  BP 118/73 mmHg  Pulse 92  Temp(Src) 98.8 F (37.1 C) (Oral)  Resp 20  Ht 5\' 2"  (1.575 m)  Wt 80.287 kg (177 lb)  BMI 32.37 kg/m2  SpO2 100%  LMP 06/18/2014 Cat I - baseline 135, accelerations present, rare variable decelerations Contractions every 2-4 minutes CVE:  Dilation: 7 Effacement (%): 90 Cervical Position: Middle Station: -1 Presentation: Vertex Exam by:: Ronni Rumble, RN  A&P: 29 y.o. [id number] [redacted]w[redacted]d admitted after SROM. Labor is slowly progressing. # Continue to titrate pitocin # Augmentation of maternal position to facilitate labor progression # Continue Pen G every 4 hrs for GBS prophylaxis  Meda Coffee, Med Student 8:55 PM  RESIDENT ADDENDUM I have separately seen and examined the patient. I have discussed the findings and exam with the medical student and agree with the above note. I helped develop the management plan that is described in the student's note, and I agree with the content.  Luiz Blare, DO 04/02/2015, 10:50 PM PGY-1, [place]

## 2015-04-02 NOTE — H&P (Signed)
Monica Buck is a 29 y.o. female presenting for evaluation of ROM. History  Patient reports that she woke up at 730 this AM with a large gush of fluid. Her sheets were soaked. No bleeding. Has started having more regular contractions, every 5 minutes and moderate in intensity. Feeling fetal movement. She is scheduled for IOL for postdates tomorrow. 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 No active lesions. She is GBS positive.   OB History    Gravida Para Term Preterm AB TAB SAB Ectopic Multiple Living   3    2  2    0     Past Medical History  Diagnosis Date  . HSV (herpes simplex virus) anogenital infection    Past Surgical History  Procedure Laterality Date  . No past surgeries     Family History: family history includes Cancer in her father; Down syndrome in her sister. Social History:  reports that she has never smoked. She has never used smokeless tobacco. She reports that she drinks alcohol. She reports that she does not use illicit drugs.   Prenatal Transfer Tool  Maternal Diabetes: No Genetic Screening: Normal Maternal Ultrasounds/Referrals: Abnormal:  Findings:   Echogenic bowel, resolved on repeat  Fetal Ultrasounds or other Referrals:  None Maternal Substance Abuse:  No Significant Maternal Medications:  Meds include: Other: valtrex Significant Maternal Lab Results:  Lab values include: Group B Strep positive Other Comments:  None  Review of Systems  Constitutional: Negative for fever.  Respiratory: Negative for shortness of breath.   Cardiovascular: Negative for chest pain.  Gastrointestinal:       Abdominal pain with contractions  Genitourinary: Negative for dysuria.  Neurological: Negative for headaches.  Psychiatric/Behavioral: Negative for depression.    Dilation: 3 Effacement (%): 70 Station: -2 Exam by:: Valda Favia RN Blood pressure  126/78, pulse 82, temperature 98.2 F (36.8 C), temperature source Oral, height 5\' 2"  (1.575 m), weight 80.287 kg (177 lb), last menstrual period 06/18/2014, unknown if currently breastfeeding. Exam Physical Exam  Constitutional: She appears well-developed and well-nourished. No distress.  Cardiovascular: Normal rate, regular rhythm and intact distal pulses.   Respiratory: Breath sounds normal. No respiratory distress.  Genitourinary:  External genitalia normal. Sterile speculum exam with mild amount of pooling in posterior fornix   Ferning positive  FHT: Cat I with baseline 130, accelerations present, no decelerations Toco: Regular contractions every 5-7 minutes  Prenatal labs: ABO, Rh: O/Positive/-- (11/05 0000) Antibody: Negative (11/05 0000) Rubella: Immune (11/05 0000) RPR: Nonreactive (11/05 0000)  HBsAg: Negative (11/05 0000)  HIV: Non-reactive (11/05 0000)  GBS: Positive (05/12 0000)   Assessment/Plan: A: [name] is a 29 y.o. G3P0020 at [redacted]w[redacted]d presenting with confirmed rupture of membranes (pooling, +ferning) and cervical change from 1 cm yesterday to 3 cm today. She is GBS positive.   P:  Admit to birthing suite Start GBS prophylaxis with penicillin per protocol  Clear liquid diet May have epidural when desired Continuous monitoring  If no change in several hours or inadequate contraction pattern, will consider pitocin therapy. Discussed with patient and she is in agreement if needed.  No DVT prophylaxis needed  Anticipate NSVD  Patient history, exam, assessment and plan discussed with Dr. Deniece Ree.   Governor Specking 04/02/2015, 9:44 AM    OB fellow attestation:  I have seen and examined this patient; I agree with above documentation in the resident's  note.   Monica Buck is a 29 y.o. G3P0020 here in early labor with rupture of membranes  PE: BP 129/71 mmHg  Pulse 70  Temp(Src) 97.9 F (36.6 C) (Oral)  Resp 20  Ht 5\' 2"  (1.575 m)  Wt 177 lb  (80.287 kg)  BMI 32.37 kg/m2  LMP 06/18/2014 Gen: calm comfortable, NAD Resp: normal effort, no distress Abd: gravid  ROS, labs, PMH reviewed  Plan: MOF: breast and bottle MOC: OCPs ID: PCN for GBS +, no active lesions and on ppx for HSV FWB: cat I Labor: monitor, if no cervical change will start pitocin for augmentation Pain: epidural upon request  Monica Buck 04/02/2015, 12:43 PM

## 2015-04-02 NOTE — MAU Note (Signed)
Pt presents to MAU with complaints of leakage of fluid that started an hour ago. Reports contractions since yesterday. Denies any vaginal bleeding

## 2015-04-02 NOTE — Progress Notes (Signed)
Patient ID: Verita Schneiders, female   DOB: Mar 19, 1986, 29 y.o.   MRN: [id number]  Labor Progress Note  S:  Epidural in place, more comfortable Tired Feeling fetal movement  O:  BP 117/75 mmHg  Pulse 93  Temp(Src) 98.2 F (36.8 C) (Oral)  Resp 20  Ht 5\' 2"  (1.575 m)  Wt 80.287 kg (177 lb)  BMI 32.37 kg/m2  SpO2 100%  LMP 06/18/2014 Cat II - baseline 130, accelerations present, rare variable deceleration CVE: at 1645, possible forebag palpated Dilation: 4 Effacement (%): 90 Cervical Position: Middle Station: -1 Presentation: Vertex Exam by:: Dr Theresa Duty   A&P: 29 y.o. [id number] [redacted]w[redacted]d admitted after SROM. Labor progressing slowly with augmentation with pitocin # Continue to titrate pitocin  Governor Specking, MD 4:48 PM

## 2015-04-03 ENCOUNTER — Encounter (HOSPITAL_COMMUNITY): Payer: Self-pay | Admitting: *Deleted

## 2015-04-03 ENCOUNTER — Inpatient Hospital Stay (HOSPITAL_COMMUNITY): Admission: RE | Admit: 2015-04-03 | Payer: Medicaid Other | Source: Ambulatory Visit

## 2015-04-03 DIAGNOSIS — Z3A41 41 weeks gestation of pregnancy: Secondary | ICD-10-CM

## 2015-04-03 DIAGNOSIS — O9832 Other infections with a predominantly sexual mode of transmission complicating childbirth: Secondary | ICD-10-CM

## 2015-04-03 DIAGNOSIS — O99824 Streptococcus B carrier state complicating childbirth: Secondary | ICD-10-CM

## 2015-04-03 DIAGNOSIS — A6000 Herpesviral infection of urogenital system, unspecified: Secondary | ICD-10-CM

## 2015-04-03 LAB — CBC
HCT: 33 % — ABNORMAL LOW (ref 36.0–46.0)
HEMOGLOBIN: 11.4 g/dL — AB (ref 12.0–15.0)
MCH: 32.1 pg (ref 26.0–34.0)
MCHC: 34.5 g/dL (ref 30.0–36.0)
MCV: 93 fL (ref 78.0–100.0)
Platelets: 130 10*3/uL — ABNORMAL LOW (ref 150–400)
RBC: 3.55 MIL/uL — AB (ref 3.87–5.11)
RDW: 14.2 % (ref 11.5–15.5)
WBC: 17.7 10*3/uL — AB (ref 4.0–10.5)

## 2015-04-03 LAB — RPR: RPR Ser Ql: NONREACTIVE

## 2015-04-03 MED ORDER — TETANUS-DIPHTH-ACELL PERTUSSIS 5-2.5-18.5 LF-MCG/0.5 IM SUSP: 0.5000 mL | Freq: Once | INTRAMUSCULAR | Status: DC

## 2015-04-03 MED ORDER — ONDANSETRON HCL 4 MG PO TABS: 4.0000 mg | ORAL_TABLET | ORAL | Status: DC | PRN

## 2015-04-03 MED ORDER — SIMETHICONE 80 MG PO CHEW: 80.0000 mg | CHEWABLE_TABLET | ORAL | Status: DC | PRN

## 2015-04-03 MED ORDER — ACETAMINOPHEN 325 MG PO TABS: 650.0000 mg | ORAL_TABLET | ORAL | Status: DC | PRN

## 2015-04-03 MED ORDER — ZOLPIDEM TARTRATE 5 MG PO TABS
5.0000 mg | ORAL_TABLET | Freq: Every evening | ORAL | Status: DC | PRN
Start: 2015-04-03 — End: 2015-04-05

## 2015-04-03 MED ORDER — SENNOSIDES-DOCUSATE SODIUM 8.6-50 MG PO TABS
2.0000 | ORAL_TABLET | ORAL | Status: DC
  Administered 2015-04-04: 2 via ORAL
  Filled 2015-04-03 (×2): qty 2

## 2015-04-03 MED ORDER — BENZOCAINE-MENTHOL 20-0.5 % EX AERO
1.0000 "application " | INHALATION_SPRAY | CUTANEOUS | Status: DC | PRN
  Administered 2015-04-03: 1 via TOPICAL
  Filled 2015-04-03: qty 56

## 2015-04-03 MED ORDER — LANOLIN HYDROUS EX OINT: TOPICAL_OINTMENT | CUTANEOUS | Status: DC | PRN

## 2015-04-03 MED ORDER — PRENATAL MULTIVITAMIN CH
1.0000 | ORAL_TABLET | Freq: Every day | ORAL | Status: DC
  Administered 2015-04-03 – 2015-04-05 (×3): 1 via ORAL
  Filled 2015-04-03 (×3): qty 1

## 2015-04-03 MED ORDER — WITCH HAZEL-GLYCERIN EX PADS: 1.0000 "application " | MEDICATED_PAD | CUTANEOUS | Status: DC | PRN

## 2015-04-03 MED ORDER — OXYCODONE-ACETAMINOPHEN 5-325 MG PO TABS
1.0000 | ORAL_TABLET | ORAL | Status: DC | PRN
  Administered 2015-04-03 – 2015-04-04 (×3): 1 via ORAL
  Filled 2015-04-03 (×3): qty 1

## 2015-04-03 MED ORDER — ONDANSETRON HCL 4 MG/2ML IJ SOLN: 4.0000 mg | INTRAMUSCULAR | Status: DC | PRN

## 2015-04-03 MED ORDER — OXYCODONE-ACETAMINOPHEN 5-325 MG PO TABS: 2.0000 | ORAL_TABLET | ORAL | Status: DC | PRN

## 2015-04-03 MED ORDER — DIBUCAINE 1 % RE OINT: 1.0000 "application " | TOPICAL_OINTMENT | RECTAL | Status: DC | PRN

## 2015-04-03 MED ORDER — IBUPROFEN 600 MG PO TABS
600.0000 mg | ORAL_TABLET | Freq: Four times a day (QID) | ORAL | Status: DC
  Administered 2015-04-03 – 2015-04-05 (×10): 600 mg via ORAL
  Filled 2015-04-03 (×10): qty 1

## 2015-04-03 MED ORDER — DIPHENHYDRAMINE HCL 25 MG PO CAPS: 25.0000 mg | ORAL_CAPSULE | Freq: Four times a day (QID) | ORAL | Status: DC | PRN

## 2015-04-03 NOTE — Lactation Note (Signed)
This note was copied from the chart of Monica Buck. Lactation Consultation Note Called to assist mom with latch. First time mom- basic teaching done. Mom able to hand express Colostrum.  Bf brochure given with resources for support after DC. No questions at present. To call for assist prn  Patient Name: Monica Milley Mellina Benison TAVWP'V Date: 04/03/2015 Reason for consult: Initial assessment   Maternal Data Formula Feeding for Exclusion: No Has patient been taught Hand Expression?: Yes Does the patient have breastfeeding experience prior to this delivery?: No  Feeding Feeding Type: Breast Fed Length of feed: 10 min  LATCH Score/Interventions Latch: Repeated attempts needed to sustain latch, nipple held in mouth throughout feeding, stimulation needed to elicit sucking reflex. Intervention(s): Assist with latch  Audible Swallowing: None Intervention(s): Skin to skin  Type of Nipple: Flat Intervention(s): No intervention needed  Comfort (Breast/Nipple): Soft / non-tender     Hold (Positioning): Assistance needed to correctly position infant at breast and maintain latch. Intervention(s): Breastfeeding basics reviewed  LATCH Score: 5  Lactation Tools Discussed/Used     Consult Status Consult Status: Follow-up Date: 04/04/15 Follow-up type: In-patient    Truddie Crumble 04/03/2015, 9:03 AM

## 2015-04-03 NOTE — Anesthesia Postprocedure Evaluation (Signed)
  Anesthesia Post-op Note  Patient: Monica Buck  Procedure(s) Performed: * No procedures listed *  Patient Location: Mother/Baby  Anesthesia Type:Epidural  Level of Consciousness: awake, alert , oriented and patient cooperative  Airway and Oxygen Therapy: Patient Spontanous Breathing  Post-op Pain: none  Post-op Assessment: Post-op Vital signs reviewed, Patient's Cardiovascular Status Stable, Respiratory Function Stable, Patent Airway, No headache, No backache and Patient able to bend at knees              Post-op Vital Signs: Reviewed and stable  Last Vitals:  Filed Vitals:   04/03/15 0920  BP: 119/62  Pulse: 90  Temp: 36.9 C  Resp: 18    Complications: No apparent anesthesia complications

## 2015-04-03 NOTE — Progress Notes (Signed)
Labor Progress Note  S: Patient continues to progress.  O:  BP 124/62 mmHg  Pulse 88  Temp(Src) 99.7 F (37.6 C) (Oral)  Resp 18  Ht 5\' 2"  (1.575 m)  Wt 80.287 kg (177 lb)  BMI 32.37 kg/m2  SpO2 100%  LMP 06/18/2014 Cat I baseline 135, accelerations present, rare variable decelerations CVE: Dilation: 10 Dilation Complete Date: 04/03/15 Dilation Complete Time: 0008 Effacement (%): 100 Cervical Position: Middle Station: +1, +2 Presentation: Vertex Exam by:: Gaspar Garbe, RN   A&P: 29 y.o. G3P0020 [redacted]w[redacted]d admitted after SROM. Patient is fully dilated. # Continue to titrate pitocin # Continue Pen G every 4 hrs for GBS prophylaxis  Meda Coffee, Med Student 12:14 AM

## 2015-04-04 LAB — CBC
HEMATOCRIT: 29.6 % — AB (ref 36.0–46.0)
HEMOGLOBIN: 10 g/dL — AB (ref 12.0–15.0)
MCH: 31.7 pg (ref 26.0–34.0)
MCHC: 33.8 g/dL (ref 30.0–36.0)
MCV: 94 fL (ref 78.0–100.0)
Platelets: 142 10*3/uL — ABNORMAL LOW (ref 150–400)
RBC: 3.15 MIL/uL — ABNORMAL LOW (ref 3.87–5.11)
RDW: 14.8 % (ref 11.5–15.5)
WBC: 15.8 10*3/uL — ABNORMAL HIGH (ref 4.0–10.5)

## 2015-04-04 NOTE — Progress Notes (Signed)
Post Partum Day 1 Subjective: no complaints, up ad lib, voiding, tolerating PO and + flatus  Having some difficulty with breastfeeding, working with lactation  Objective: Blood pressure 106/52, pulse 65, temperature 98.1 F (36.7 C), temperature source Oral, resp. rate 18, height 5\' 2"  (1.575 m), weight 80.287 kg (177 lb), last menstrual period 06/18/2014, SpO2 100 %, unknown if currently breastfeeding.  Physical Exam:  General: alert, cooperative and no distress  Chest: Lungs CTAB CV: RRR no m/r/g Lochia: appropriate Uterine Fundus: firm DVT Evaluation: No cords or calf tenderness. No significant calf/ankle edema.   Recent Labs  04/03/15 0351 04/04/15 0528  HGB 11.4* 10.0*  HCT 33.0* 29.6*    Assessment/Plan: 55 y.K.[id number] after NSVD at [redacted]w[redacted]d. Doing well on postpartum day #1 but some difficulties with breastfeeding. Plan to work with Science writer today. Anticipate discharge in the AM.  Plan for OCP for postpartum birth control.    LOS: 2 days   Governor Specking 04/04/2015, 7:53 AM

## 2015-04-04 NOTE — Progress Notes (Signed)
  CLINICAL SOCIAL WORK MATERNAL/CHILD NOTE  Patient Details  Name: Monica Buck MRN: [id number] Date of Birth: 04/03/2015  Date:  04/04/2015  Clinical Social Worker Initiating Note:  Lucita Ferrara, LCSW Date/ Time Initiated:  04/04/15/1000     Child's Name:  Monica Buck   Legal Guardian:  Monica Buck (mother) and Monica Buck (father)  Need for Interpreter:  None   Date of Referral:  04/03/15     Reason for Referral:  History of depression  Referral Source:  [facility]   Address:  [address], [place] [address]  Phone number:  [phone]   Household Members:  Parents, Significant Other   Natural Supports (not living in the home):  Extended Family, Immediate Family, Friends   Professional Supports: Transport planner, Case Metallurgist (at health department)   Financial Resources:  Self-Pay    Other Resources:  [employer], Physicist, medical    Cultural/Religious Considerations Which May Impact Care:  None reported  Strengths:  Ability to meet basic needs , Home prepared for child    Risk Factors/Current Problems:   1) Family/Relationship stress/Symptoms of depression: Prenatal records highlight that MOB reported symptoms of depression secondary to pending divorce.  MOB stated that she participated in therapy and spoke with the LCSW at the Health Department and is feeling "better".  MOB denied ongoing relational stress or symptoms of depression.  Cognitive State:  Able to Concentrate , Alert , Goal Oriented , Linear Thinking    Mood/Affect:  Flat , Euthymic (but MOB reported feeling tired and exhausted since the infant cries whenever placed in the crib)   CSW Assessment:  CSW received referral for maternal history of depression.  MOB and FOB were in the room for the assessment. MOB provided consent for assessment while FOB in the room.  MOB discussed feeling tired, and expressed hope that she will be able to sleep more once she returns home since she  has the support of her parents.  MOB confirmed that the home is prepared for the infant and that she is excited to become a mother.    MOB reported history of depression/anxiety from "long ago".  She originally denied mental health symptoms during the pregnancy.  CSW directly inquired about symptoms of depression that are documented in her chart. MOB stated that it was "early" in her pregnancy, but shared that symptoms have resolved. Per MOB, she and the FOB were experiencing stress in the relationship. She stated that she and the FOB participated in therapy and she spoke with the social worker at the health department. MOB reported that these supports were beneficial and helpful. MOB reported that she has not yet received education on perinatal mood and anxiety disorders.  CSW provided education and encouraged close monitoring of symptoms since MOB presents with increased risk.  CSW also reviewed maternal protective factors (has support system), and discussed strategies to help support maternal mental health during the postpartum period.  MOB agreed to contact her medical provider if she notes onset of symptoms.   MOB denied additional questions, concerns, or needs at this time. Appreciation was expressed for the visit. MOB agreed to contact CSW if needs arise.  CSW Plan/Description:   1) Patient/Family Education: Perinatal mood and anxiety disorders 2) No Further Intervention Required/No Barriers to Discharge    Sheilah Mins, LCSW 04/04/2015, 12:01 PM

## 2015-04-05 NOTE — Discharge Summary (Signed)
Obstetric Discharge Summary Reason for Admission: onset of labor Prenatal Procedures: none Intrapartum Procedures: spontaneous vaginal delivery Postpartum Procedures: none Complications-Operative and Postpartum: 1st degree perineal laceration  Patient is 29 y.o. G3P0020 [redacted]w[redacted]d admitted SROM, hx of HSV-2.  Delivery Note At 2:56 AM a viable female was delivered via Vaginal, Spontaneous Delivery (Presentation: Occiput Anterior). APGAR: 7, 9; weight 3595g. Placenta status: Intact, Spontaneous. Cord: 3 vessels with the following complications: None.  Anesthesia: Epidural  Episiotomy: None Lacerations: 1st degree perineal - repaired with figure 8 suture Suture Repair: 3.0 vicryl Est. Blood Loss (mL): 199  Upon arrival patient was complete and had been pushing for 2 hrs. She pushed with fair maternal effort. Dr. Elonda Husky was called to beside to consider vacuum assisted delivery due to poor maternal effort and exhaustion. She eventually delivered a healthy baby girl without assistance. Baby delivered without difficulty, was noted to have good tone and place on maternal abdomen for oral suctioning, drying and stimulation. Delayed cord clamping performed. Placenta delivered intact with 3V cord. Vaginal canal and perineum was inspected and only with 1st degree perineal laceration. Figure of 8 suture used to make site hemostatic. Pitocin was started and uterus massaged until bleeding slowed. Counts of sharps, instruments, and lap pads were all correct.   Mom to postpartum. Baby to Couplet care / Skin to Skin.  Luiz Blare, DO 04/03/2015, 3:23 AM PGY-1, [facility] Course:  Active Problems:   Active labor   Monica Buck is a 29 y.o. [id number] s/p SVD.  Patient was admitted with rupture of membranes.  She has postpartum course that was uncomplicated including no problems with ambulating, PO intake, urination, pain, or bleeding. The pt feels ready to go home and  will  be discharged with outpatient follow-up.   Today: No acute events overnight.  Pt denies problems with ambulating, voiding or po intake.   Pain is well controlled.  Lochia Minimal.    Plan for birth control is  Method of Feeding:  Physical Exam:  General: alert and cooperative Lochia: appropriate Uterine Fundus: firm DVT Evaluation: No evidence of DVT seen on physical exam.  H/H: Lab Results  Component Value Date/Time   HGB 10.0* 04/04/2015 05:28 AM   HCT 29.6* 04/04/2015 05:28 AM    Discharge Diagnoses: Term Pregnancy-delivered  Discharge Information: Date: 04/05/2015 Activity: pelvic rest Diet: routine  Medications: None Breast feeding:  Yes Condition: stable Instructions: refer to handout Discharge to: home   Discharge Instructions    Call MD for:  redness, tenderness, or signs of infection (pain, swelling, redness, odor or green/yellow discharge around incision site)    Complete by:  As directed      Call MD for:  severe uncontrolled pain    Complete by:  As directed      Call MD for:  temperature >100.4    Complete by:  As directed      Diet - low sodium heart healthy    Complete by:  As directed             Medication List    TAKE these medications        acetaminophen 500 MG tablet  Commonly known as:  TYLENOL  Take 1,000 mg by mouth every 6 (six) hours as needed for moderate pain.     PRENATAL VITAMINS PO  Take 1 tablet by mouth daily.     valACYclovir 1000 MG tablet  Commonly known as:  VALTREX  Take 500 mg  by mouth 2 (two) times daily.           Follow-up Information    Follow up with [facility] [place] In 5 weeks.   Contact information:   [address] [place]       04/05/2015,5:19 AM

## 2015-04-05 NOTE — Lactation Note (Signed)
This note was copied from the chart of Monica [place]. Lactation Consultation Note  Patient Name: Monica Buck [id number] Date: 04/05/2015 Reason for consult: Follow-up assessment   With this mom of a term baby, now 51 hours old. Mom and dad have been syringe feeding hte baby - finger in mouth with syringe in side of mouth. I explained this may cause chling, and they agreed to use a bottle when home. Mom has been pumping some, and has a few drops of colostrum. i explained the importance to latching baby prior to bottle, if possible, and hand expression after after pumpin at least 5-8 times a day. Mom encouraged to call for an o/p lactation appointment, prior to baby being 33 weeks old, and to bring her pump kit with her.  Parents very receptive to teaching. Mom given new comfort gels, and instructed in their use. Breast care/engforegemtn care reviewed also.    Maternal Data    Feeding    LATCH Score/Interventions    Intervention(s): Hand expression  Type of Nipple: Inverted (left nipple slightly inveted, right everted, but cone shaped to areoal) Intervention(s): Shells;Hand pump;Double electric pump  Comfort (Breast/Nipple): Filling, red/small blisters or bruises, mild/mod discomfort (right nipple with crack, no redness)  Problem noted: Mild/Moderate discomfort;Cracked, bleeding, blisters, bruises Interventions  (Cracked/bleeding/bruising/blister): Expressed breast milk to nipple        Lactation Tools Discussed/Used     Consult Status Consult Status: Complete Follow-up type: Call as needed    Monica Buck 04/05/2015, 9:38 AM

## 2015-04-05 NOTE — Discharge Instructions (Signed)
Parto vaginal, Cuidados posteriores  (Vaginal Delivery, Care After) Siga estas instrucciones durante las prximas semanas. Estas indicaciones para el alta le proporcionan informacin general acerca de cmo deber cuidarse despus del parto. El mdico tambin podr darle instrucciones especficas. El tratamiento ha sido planificado segn las prcticas mdicas actuales, pero en algunos casos pueden ocurrir problemas. Comunquese con el mdico si tiene algn problema o tiene preguntas al volver a su casa.  INSTRUCCIONES PARA EL CUIDADO EN EL HOGAR   Tome slo medicamentos de venta libre o recetados, segn las indicaciones del mdico o del Development worker, international aid.  No beba alcohol, especialmente si est amamantando o toma analgsicos.  No mastique tabaco ni fume.  No consuma drogas.  Contine con un adecuado cuidado perineal. El buen cuidado perineal incluye:  Higienizarse de adelante hacia atrs.  Mantener la zona perineal limpia.  No use tampones ni duchas vaginales hasta que su mdico la autorice.  Dchese, lvese el cabello y tome baos de inmersin segn las indicaciones de su mdico.  Utilice un sostn que le ajuste bien y que brinde buen soporte a sus Glass blower/designer.  Consuma alimentos saludables.  Beba suficiente lquido para Consulting civil engineer orina clara o de color amarillo plido.  Consuma alimentos ricos en fibra como cereales y panes integrales, arroz, frijoles y frutas y verduras frescas todos los [place]. Estos alimentos pueden ayudarla a prevenir o Cytogeneticist.  Siga las recomendaciones de su mdico relacionadas con la reanudacin de actividades como subir escaleras, conducir automviles, levantar objetos, hacer ejercicios o viajar.  Hable con su mdico acerca de reanudar la actividad sexual. Volver a la actividad sexual depende del riesgo de infeccin, la velocidad de la curacin y la comodidad y su deseo de Financial controller.  Trate de que alguien la ayude con las actividades del hogar y con  el recin nacido al menos durante un par de das despus de salir del hospital.  Descanse todo lo que pueda. Trate de descansar o tomar una siesta mientras el beb est durmiendo.  Aumente sus actividades gradualmente.  Cumpla con todas las visitas de control programadas para despus del parto. Es muy importante asistir a todas las Teacher, English as a foreign language de seguimiento. En estas citas, su mdico va a controlarla para asegurarse de que est sanando fsica y emocionalmente. SOLICITE ATENCIN MDICA SI:   Elimina cogulos grandes por la vagina. Guarde algunos cogulos para mostrarle al mdico.  Tiene una secrecin con feo olor que proviene de la vagina.  Tiene dificultad para orinar.  Orina con frecuencia.  Siente dolor al [employer].  Nota un cambio en sus movimientos intestinales.  Aumenta el enrojecimiento, el dolor o la hinchazn en la zona de la incisin vaginal (episiotoma) o el desgarro vaginal.  Tiene pus que drena por la episiotoma o el desgarro vaginal.  La episiotoma o el desgarro vaginal se abren.  Sus [employer] duelen, estn duras o enrojecidas.  Sufre un dolor intenso de [place].  Tiene visin borrosa o ve manchas.  Se siente triste o deprimida.  Tiene pensamientos acerca de lastimarse o daar al recin nacido.  Tiene preguntas acerca de su cuidado personal, el cuidado del recin nacido o acerca de los medicamentos.  Se siente mareada o sufre un desmayo.  Tiene una erupcin.  Tiene nuseas o vmitos.  Usted amamant al beb y no ha tenido su perodo menstrual dentro de las 12 semanas despus de dejar de Economist.  No amamanta al beb y no tuvo su perodo menstrual en las ltimas 12 semanas despus del  partoJaclynn Buck. SOLICITE ATENCIN MDICA DE INMEDIATO SI:   Siente dolor persistente.  Siente dolor en el pecho.  Le falta el aire.  Se desmaya.  Siente dolor en la pierna.  Siente Research scientist (life sciences).  El sangrado vaginal satura dos o ms  apsitos en 1 hora. ASEGRESE DE QUE:   Comprende estas instrucciones.  Controlar su enfermedad.  Recibir ayuda de inmediato si no mejora o si empeora. Document Released: 10/11/2005 Document Revised: 06/13/2013 [facility] Patient Information 2015 [place]. This information is not intended to replace advice given to you by your health care provider. Make sure you discuss any questions you have with your health care provider.

## 2015-04-10 ENCOUNTER — Encounter (HOSPITAL_COMMUNITY): Payer: Self-pay | Admitting: *Deleted

## 2015-04-10 ENCOUNTER — Inpatient Hospital Stay (HOSPITAL_COMMUNITY)
Admission: AD | Admit: 2015-04-10 | Discharge: 2015-04-10 | Disposition: A | Discharge status: Home / Self Care | Payer: Self-pay | Source: Ambulatory Visit | Attending: Obstetrics & Gynecology | Admitting: Obstetrics & Gynecology

## 2015-04-10 DIAGNOSIS — R52 Pain, unspecified: Secondary | ICD-10-CM

## 2015-04-10 DIAGNOSIS — O9089 Other complications of the puerperium, not elsewhere classified: Secondary | ICD-10-CM | POA: Insufficient documentation

## 2015-04-10 DIAGNOSIS — O872 Hemorrhoids in the puerperium: Secondary | ICD-10-CM | POA: Insufficient documentation

## 2015-04-10 DIAGNOSIS — R51 Headache: Secondary | ICD-10-CM | POA: Insufficient documentation

## 2015-04-10 DIAGNOSIS — R102 Pelvic and perineal pain: Secondary | ICD-10-CM | POA: Insufficient documentation

## 2015-04-10 DIAGNOSIS — K5900 Constipation, unspecified: Secondary | ICD-10-CM | POA: Insufficient documentation

## 2015-04-10 LAB — URINE MICROSCOPIC-ADD ON

## 2015-04-10 LAB — URINALYSIS, ROUTINE W REFLEX MICROSCOPIC
Bilirubin Urine: NEGATIVE
Glucose, UA: NEGATIVE mg/dL
KETONES UR: NEGATIVE mg/dL
NITRITE: NEGATIVE
PROTEIN: NEGATIVE mg/dL
Specific Gravity, Urine: 1.015 (ref 1.005–1.030)
UROBILINOGEN UA: 0.2 mg/dL (ref 0.0–1.0)
pH: 6.5 (ref 5.0–8.0)

## 2015-04-10 LAB — WET PREP, GENITAL
CLUE CELLS WET PREP: NONE SEEN
TRICH WET PREP: NONE SEEN
Yeast Wet Prep HPF POC: NONE SEEN

## 2015-04-10 MED ORDER — HYDROCORTISONE 2.5 % RE CREA
TOPICAL_CREAM | Freq: Once | RECTAL | Status: AC
  Administered 2015-04-10: 15:00:00 via RECTAL
  Filled 2015-04-10 (×2): qty 28.35

## 2015-04-10 MED ORDER — FLUCONAZOLE 150 MG PO TABS
150.0000 mg | ORAL_TABLET | Freq: Once | ORAL | Status: AC
  Administered 2015-04-10: 150 mg via ORAL
  Filled 2015-04-10: qty 1

## 2015-04-10 MED ORDER — DOCUSATE SODIUM 100 MG PO CAPS: 100.0000 mg | ORAL_CAPSULE | Freq: Two times a day (BID) | ORAL | Status: AC

## 2015-04-10 NOTE — MAU Provider Note (Signed)
History     CSN: [id number]  Arrival date and time: 04/10/15 1257   First Provider Initiated Contact with Patient 04/10/15 1339      Chief Complaint  Patient presents with  . Foreign Body in Vagina   HPI  Pt is 3 weeks post partum and presents with vaginal pain/ burning with urine contact.- denies frequency of urination or incomplete emptying Pt is concerned that something is hanging out of her vagina- not painful but says it was not there prior to delivery Pt has been constipation- had a hard bowel movement and had some rectal bleeding 2 days ago Pt denies fever or chills.  Past Medical History  Diagnosis Date  . HSV (herpes simplex virus) anogenital infection   . Depression     r/t divorce  . Thrombocytopenia     Past Surgical History  Procedure Laterality Date  . No past surgeries      Family History  Problem Relation Age of Onset  . Cancer Father   . Down syndrome Sister     History  Substance Use Topics  . Smoking status: Never Smoker   . Smokeless tobacco: Never Used  . Alcohol Use: Yes     Comment: occasional drink    Allergies:  Allergies  Allergen Reactions  . Pork-Derived Products Rash    Prescriptions prior to admission  Medication Sig Dispense Refill Last Dose  . acetaminophen (TYLENOL) 500 MG tablet Take 1,000 mg by mouth every 6 (six) hours as needed for moderate pain.   04/09/2015 at Unknown time  . Prenatal Multivit-Min-Fe-FA (PRENATAL VITAMINS PO) Take 1 tablet by mouth daily.    04/09/2015 at Unknown time  . valACYclovir (VALTREX) 1000 MG tablet Take 500 mg by mouth 2 (two) times daily.   04/09/2015 at Unknown time    Review of Systems  Constitutional: Positive for chills. Negative for fever.  Eyes: Negative for blurred vision and photophobia.  Gastrointestinal: Positive for abdominal pain and constipation. Negative for nausea, vomiting and diarrhea.  Genitourinary: Positive for dysuria. Negative for urgency and frequency.   Neurological: Positive for headaches.   Physical Exam   Blood pressure 122/73, pulse 91, temperature 98.8 F (37.1 C), temperature source Oral, resp. rate 18, height 5\' 2"  (1.575 m), weight 159 lb 6.4 oz (72.303 kg), last menstrual period 06/18/2014, unknown if currently breastfeeding.  Physical Exam  Nursing note and vitals reviewed. Constitutional: She is oriented to person, place, and time. She appears well-developed and well-nourished. No distress.  HENT:  Head: Normocephalic.  Eyes: Pupils are equal, round, and reactive to light.  Neck: Normal range of motion. Neck supple.  Cardiovascular: Normal rate.   Respiratory: Effort normal.  GI: Soft. She exhibits no distension. There is tenderness. There is no rebound and no guarding.  Mild LLQ tenderness with palpation  Genitourinary:  Introitus healing well- 1st degree tear intact with sutures- nontender - small amount of lochia noted- elongated flesh colored lesion noted at 5 oclock at introitus- not tender to touch- not near vaginal tear Prominent hemorrhoids- reddened and edematous- not bleeding at this time  Musculoskeletal: Normal range of motion.  Neurological: She is alert and oriented to person, place, and time.  Skin: Skin is warm and dry.  Psychiatric: She has a normal mood and affect.    MAU Course  Procedures Results for orders placed or performed during the hospital encounter of 04/10/15 (from the past 24 hour(s))  Urinalysis, Routine w reflex microscopic (not at [facility])  Status: Abnormal   Collection Time: 04/10/15  1:01 PM  Result Value Ref Range   Color, Urine YELLOW YELLOW   APPearance HAZY (A) CLEAR   Specific Gravity, Urine 1.015 1.005 - 1.030   pH 6.5 5.0 - 8.0   Glucose, UA NEGATIVE NEGATIVE mg/dL   Hgb urine dipstick LARGE (A) NEGATIVE   Bilirubin Urine NEGATIVE NEGATIVE   Ketones, ur NEGATIVE NEGATIVE mg/dL   Protein, ur NEGATIVE NEGATIVE mg/dL   Urobilinogen, UA 0.2 0.0 - 1.0 mg/dL   Nitrite  NEGATIVE NEGATIVE   Leukocytes, UA MODERATE (A) NEGATIVE  Urine microscopic-add on     Status: Abnormal   Collection Time: 04/10/15  1:01 PM  Result Value Ref Range   Squamous Epithelial / LPF FEW (A) RARE   WBC, UA 3-6 <3 WBC/hpf   RBC / HPF 21-50 <3 RBC/hpf  urine culture pending Diflucan 150mg  PO given to patient in MAU Wet prep in process when pt left Assessment and Plan  Vaginal pain Hemorrhoids- anusol HC and stoll softener Constipation- Colace BID- high fiber diet and increase fluids Headaches-tylenol- increase fluids- if severe- go to [facility] ED F/u with PP appointment Waqas Bruhl 04/10/2015, 1:40 PM

## 2015-04-10 NOTE — MAU Note (Signed)
Post Partum on 04/03/15. Staed she is having pain and burning with urination and feels like something is coming out of her vaginal above the stiches.

## 2015-04-12 LAB — CULTURE, OB URINE

## 2015-10-21 IMAGING — US US OB TRANSVAGINAL
1 series · 13 of 28 positions shown · non-contrast
Comparison: None.

CLINICAL DATA: Pelvic cramping and vaginal bleeding.

EXAM:
OBSTETRIC <14 WK US AND TRANSVAGINAL OB US
TECHNIQUE: Both transabdominal and transvaginal ultrasound examinations were
performed for complete evaluation of the gestation as well as the
maternal uterus, adnexal regions, and pelvic cul-de-sac.
Transvaginal technique was performed to assess early pregnancy.

[Series 1: us ob comp less 14 wks · 13 of 51 slices shown]
[im 2/51]
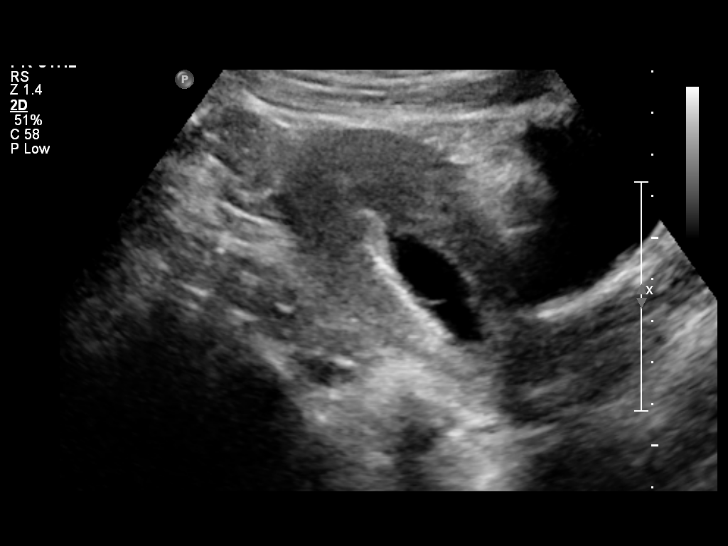
[im 6/51]
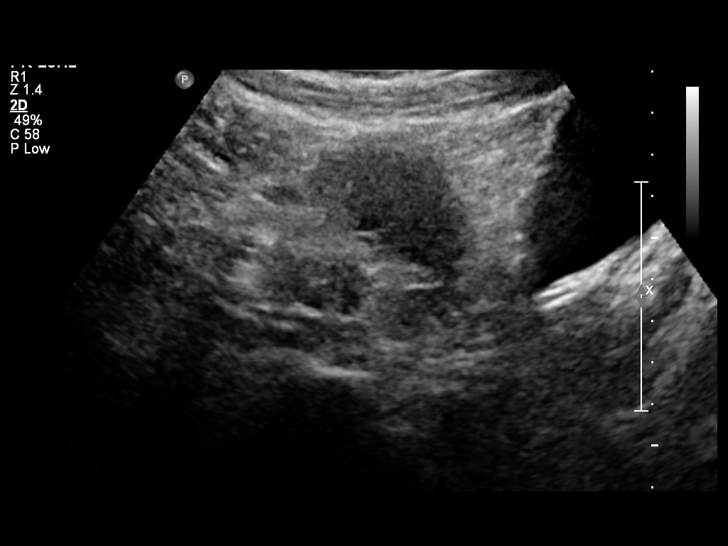
[im 10/51]
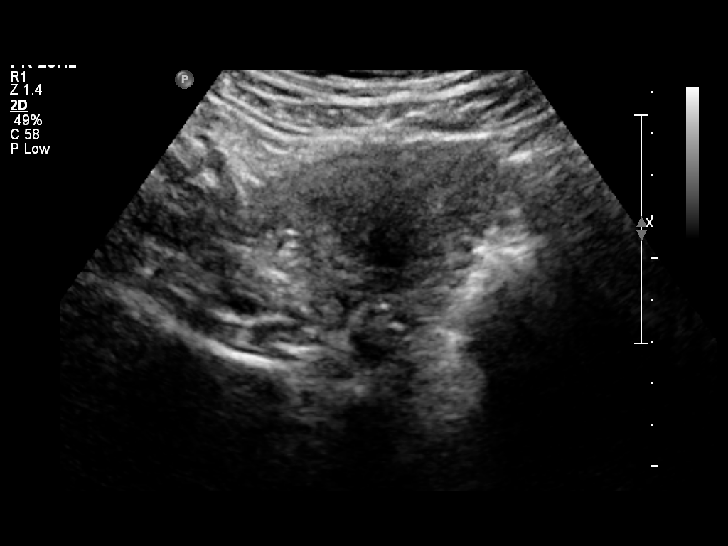
[im 13/51]
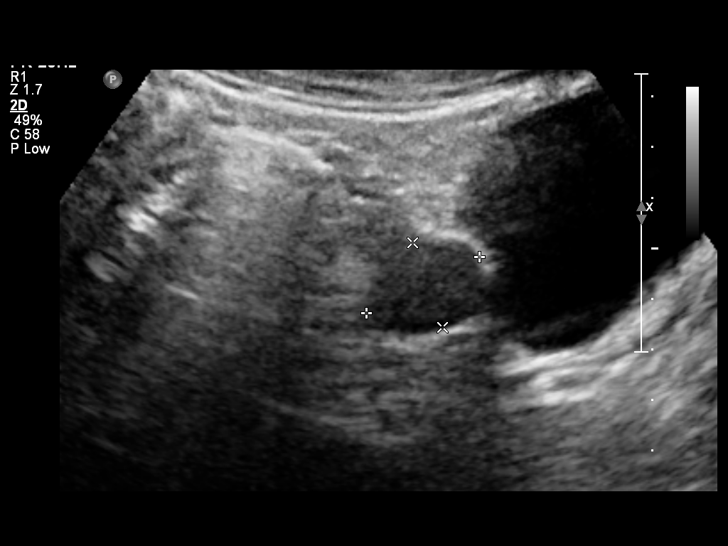
[im 17/51]
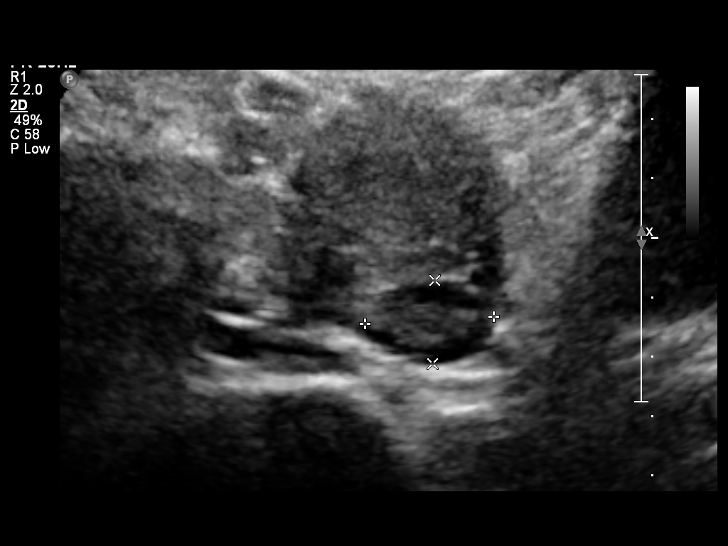
[im 21/51]
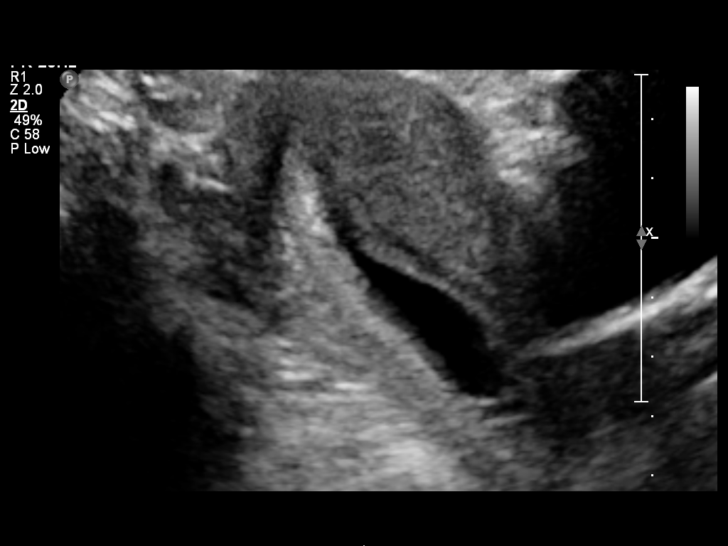
[im 26/51]
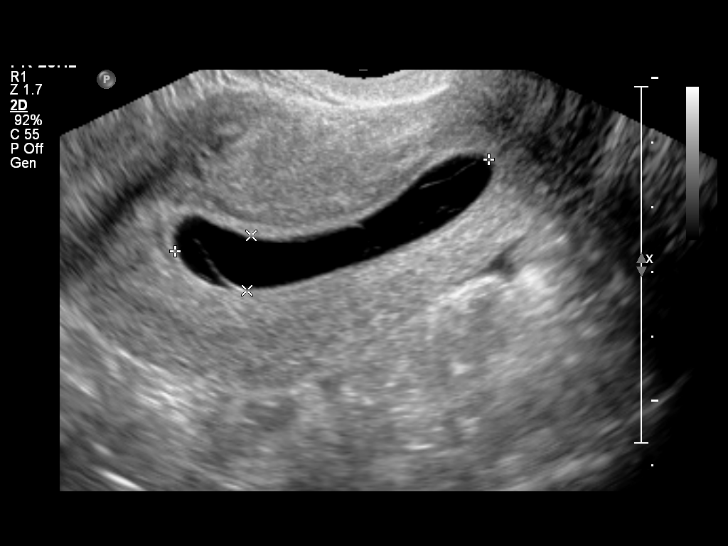
[im 30/51]
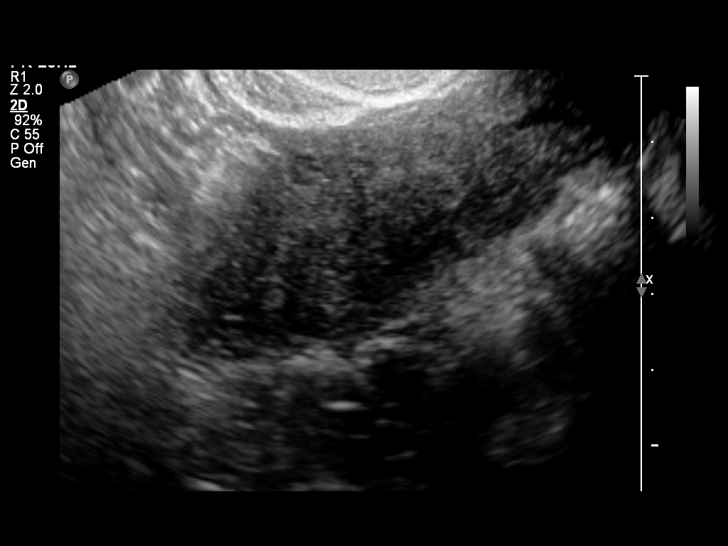
[im 34/51]
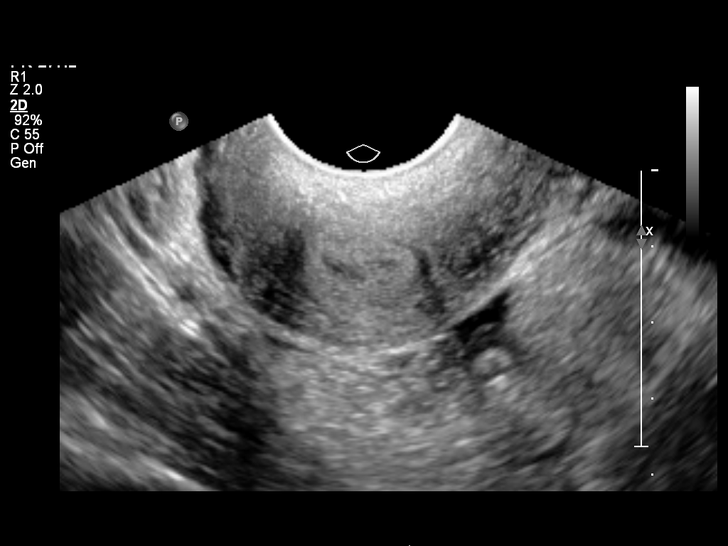
[im 38/51]
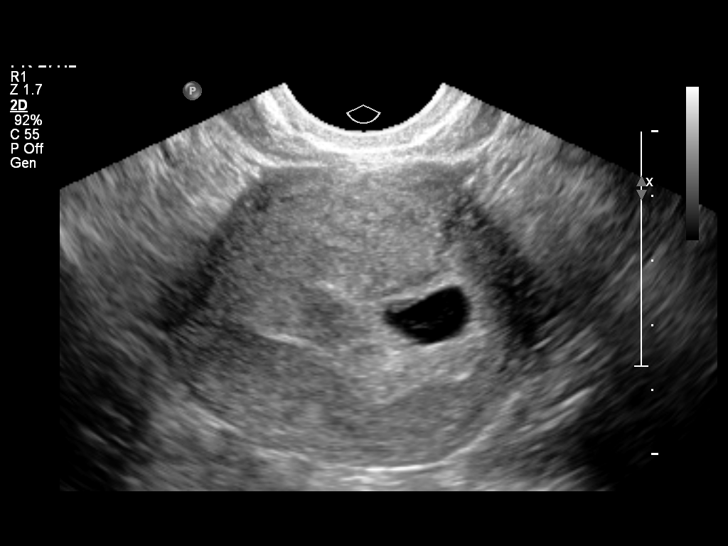
[im 41/51]
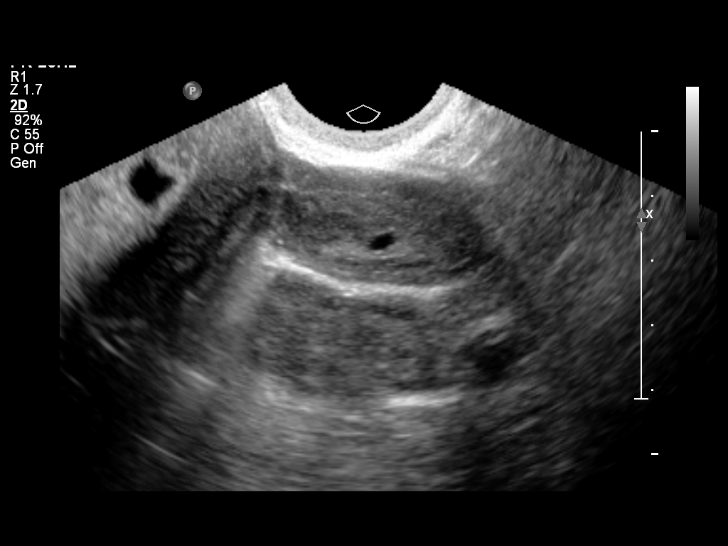
[im 45/51]
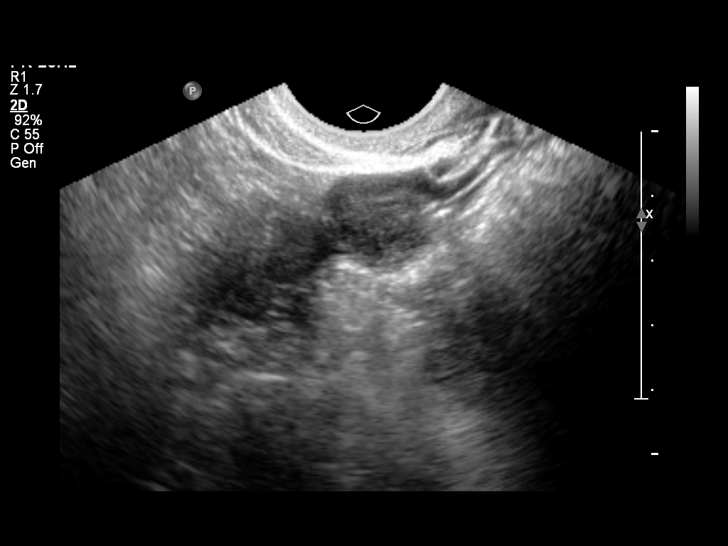
[im 49/51]
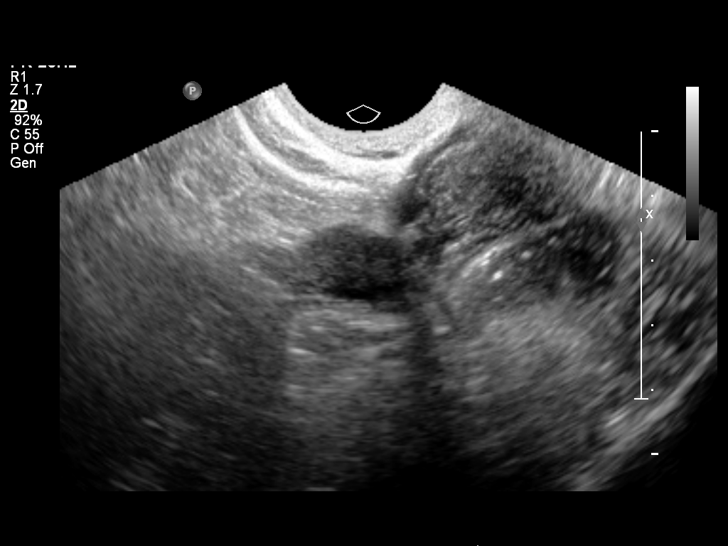

[13 of 28 positions shown; findings below may reference images not displayed]

FINDINGS: Intrauterine gestational sac: Visualized / somewhat elongated in
appearance.

Yolk sac:  No

Embryo:  No

Cardiac Activity: N/A

MSD: 23.5 mm   7 w   3  d

Maternal uterus/adnexae: No subchorionic hemorrhage is noted. The
uterus is otherwise unremarkable in appearance.

The ovaries are within normal limits. The right ovary measures 2.2 x
1.4 x 1.3 cm, while the left ovary measures 2.5 x 1.8 x 1.9 cm. No
suspicious adnexal masses are seen; there is no evidence for ovarian
torsion.

No free fluid is seen within the pelvic cul-de-sac.
IMPRESSION: Single intrauterine gestational sac noted within the uterus,
somewhat elongated in appearance. No yolk sac or embryo again seen.
Findings are suspicious but not yet definitive for failed pregnancy.
Recommend follow-up US in 10-14 days for definitive diagnosis. This
recommendation follows SRU consensus guidelines: Diagnostic Criteria
for Nonviable Pregnancy Early in the First Trimester. N Engl J Med

## 2016-03-31 ENCOUNTER — Emergency Department
Admission: EM | Admit: 2016-03-31 | Discharge: 2016-03-31 | Disposition: A | Discharge status: Home / Self Care | Payer: Medicaid Other | Attending: Emergency Medicine | Admitting: Emergency Medicine

## 2016-03-31 DIAGNOSIS — Z79899 Other long term (current) drug therapy: Secondary | ICD-10-CM | POA: Insufficient documentation

## 2016-03-31 DIAGNOSIS — H6091 Unspecified otitis externa, right ear: Secondary | ICD-10-CM | POA: Insufficient documentation

## 2016-03-31 DIAGNOSIS — F329 Major depressive disorder, single episode, unspecified: Secondary | ICD-10-CM | POA: Insufficient documentation

## 2016-03-31 MED ORDER — NEOMYCIN-POLYMYXIN-HC 3.5-10000-1 OT SOLN: 3.0000 [drp] | Freq: Four times a day (QID) | OTIC | Status: AC

## 2016-03-31 MED ORDER — PREDNISONE 20 MG PO TABS: ORAL_TABLET | ORAL | Status: AC

## 2016-03-31 NOTE — Discharge Instructions (Signed)
Ear Drops, Adult You need to put eardrops in your ear. HOME CARE   Put drops in your affected ear as told.  After putting in the drops, lie down with the ear you put the drops in facing up. Stay this way for 10 minutes. Use the ear drops as long as your doctor tells you.  Before you get up, put a cotton ball gently in your ear. Do not push it far in your ear.  Do not wash out your ears unless your doctor says it is okay.  Finish all medicines as told by your doctor. You may be told to keep using the eardrops even if you start to feel better.  See your doctor as told for follow-up visits. GET HELP IF:  You have pain that gets worse.  Any unusual fluid (drainage) is coming from your ear (especially if the fluid stinks).  You have trouble hearing.  You get really dizzy as if the room is spinning and feel sick to your stomach (vertigo).  The outside of your ear becomes red or puffy or both. This may be a sign of an allergic reaction. MAKE SURE YOU:   Understand these instructions.  Will watch your condition.  Will get help right away if you are not doing well or get worse.   This information is not intended to replace advice given to you by your health care provider. Make sure you discuss any questions you have with your health care provider.   Document Released: 03/31/2010 Document Revised: 11/01/2014 Document Reviewed: 05/08/2013 Elsevier Interactive Patient Education 2016 Elsevier Inc.  Otitis Externa Otitis externa is a bacterial or fungal infection of the outer ear canal. This is the area from the eardrum to the outside of the ear. Otitis externa is sometimes called "swimmer's ear." CAUSES  Possible causes of infection include:  Swimming in dirty water.  Moisture remaining in the ear after swimming or bathing.  Mild injury (trauma) to the ear.  Objects stuck in the ear (foreign body).  Cuts or scrapes (abrasions) on the outside of the ear. SIGNS AND SYMPTOMS    The first symptom of infection is often itching in the ear canal. Later signs and symptoms may include swelling and redness of the ear canal, ear pain, and yellowish-white fluid (pus) coming from the ear. The ear pain may be worse when pulling on the earlobe. DIAGNOSIS  Your health care provider will perform a physical exam. A sample of fluid may be taken from the ear and examined for bacteria or fungi. TREATMENT  Antibiotic ear drops are often given for 10 to 14 days. Treatment may also include pain medicine or corticosteroids to reduce itching and swelling. HOME CARE INSTRUCTIONS   Apply antibiotic ear drops to the ear canal as prescribed by your health care provider.  Take medicines only as directed by your health care provider.  If you have diabetes, follow any additional treatment instructions from your health care provider.  Keep all follow-up visits as directed by your health care provider. PREVENTION   Keep your ear dry. Use the corner of a towel to absorb water out of the ear canal after swimming or bathing.  Avoid scratching or putting objects inside your ear. This can damage the ear canal or remove the protective wax that lines the canal. This makes it easier for bacteria and fungi to grow.  Avoid swimming in lakes, polluted water, or poorly chlorinated pools.  You may use ear drops made of rubbing alcohol  and vinegar after swimming. Combine equal parts of white vinegar and alcohol in a bottle. Put 3 or 4 drops into each ear after swimming. SEEK MEDICAL CARE IF:   You have a fever.  Your ear is still red, swollen, painful, or draining pus after 3 days.  Your redness, swelling, or pain gets worse.  You have a severe headache.  You have redness, swelling, pain, or tenderness in the area behind your ear. MAKE SURE YOU:   Understand these instructions.  Will watch your condition.  Will get help right away if you are not doing well or get worse.   This information is  not intended to replace advice given to you by your health care provider. Make sure you discuss any questions you have with your health care provider.   Document Released: 10/11/2005 Document Revised: 11/01/2014 Document Reviewed: 10/28/2011 Elsevier Interactive Patient Education [employer].

## 2016-03-31 NOTE — ED Notes (Signed)
E sig pad not available, pt verbalized understanding

## 2016-03-31 NOTE — ED Notes (Signed)
Pt c/o R ear pain for last 2 weeks gradually worsening.  Pt sts she has been to [facility] twice, last time being Monday and was placed on amoxicillin.  Pt sts pain no better. NAD.

## 2016-03-31 NOTE — ED Provider Notes (Signed)
[facility] Emergency Department Provider Note  ____________________________________________  Time seen: Approximately 7:20 PM  I have reviewed the triage vital signs and the nursing notes.   HISTORY  Chief Complaint Otalgia    HPI Monica Buck is a 30 y.o. female, NAD, presents to the emergency department with complaint of worsening right ear pain for 2 weeks. States that as the pain began to worsen she was unable to hear out of her right ear. Visited her primary care provider who told her she had excessive cerumen and gave her drops without relief. Presented to the clinic a second time due to muffled hearing and was given Amoxicillin. She has been taking the medication for 3 days as prescribed but reports no relief. Pain persists and is rated at 10/10 and described as soreness and pressure around the ear with an occasional sharp sensation. Took Ibuprofen 600mg  po x 2 days and Tylenol 500 mg 2 po x 2 days without relief. She reports no recent URI symptoms and states that her daughter has not been sick either. Admits to clear otorrhea, headache, and chills. Denies fevers, night sweats, abdominal pain, nausea, and vomiting.    Past Medical History  Diagnosis Date  . HSV (herpes simplex virus) anogenital infection   . Depression     r/t divorce  . Thrombocytopenia [facility])     Patient Active Problem List   Diagnosis Date Noted  . Active labor 04/02/2015  . Post-term pregnancy, 40-42 weeks of gestation   . Echogenic focus of bowel of fetal affecting antepartum care of mother   . Fetal echogenic bowel of fetus   . Family history of Down syndrome 09/17/2014    Past Surgical History  Procedure Laterality Date  . No past surgeries      Current Outpatient Rx  Name  Route  Sig  Dispense  Refill  . acetaminophen (TYLENOL) 500 MG tablet   Oral   Take 1,000 mg by mouth every 6 (six) hours as needed for moderate pain.         Marland Kitchen docusate sodium (COLACE) 100  MG capsule   Oral   Take 1 capsule (100 mg total) by mouth every 12 (twelve) hours.   60 capsule   0   . neomycin-polymyxin-hydrocortisone (CORTISPORIN) otic solution   Right Ear   Place 3 drops into the right ear 4 (four) times daily.   10 mL   0   . predniSONE (DELTASONE) 20 MG tablet      Take 2 tablets by mouth, once daily, for 5 days   10 tablet   0   . Prenatal Multivit-Min-Fe-FA (PRENATAL VITAMINS PO)   Oral   Take 1 tablet by mouth daily.          . valACYclovir (VALTREX) 1000 MG tablet   Oral   Take 500 mg by mouth 2 (two) times daily.           Allergies Pork-derived products  Family History  Problem Relation Age of Onset  . Cancer Father   . Down syndrome Sister     Social History Social History  Substance Use Topics  . Smoking status: Never Smoker   . Smokeless tobacco: Never Used  . Alcohol Use: Yes     Comment: occasional drink     Review of Systems  Constitutional: Positive for chills. No fever, fatigue Eyes: No visual changes. No discharge, pain, dryness, redness ENT: Positive right ear pain with clear drainage. No tinnitus, sore  throat, nasal congestion, nasal drainage, sinus pressure Cardiovascular: No chest pain. Respiratory: No cough, chest congestion. No shortness of breath.  Gastrointestinal: No abdominal pain.  No nausea, vomiting.   Musculoskeletal: Negative for general myalgias, neck pain.  Skin: Negative for rash, redness, skin sores. Neurological: Negative for headaches, focal weakness or numbness. No tingling 10-point ROS otherwise negative.  ____________________________________________   PHYSICAL EXAM:  VITAL SIGNS: ED Triage Vitals  Enc Vitals Group     BP 03/31/16 1859 124/61 mmHg     Pulse Rate 03/31/16 1859 67     Resp 03/31/16 1859 18     Temp 03/31/16 1859 98.4 F (36.9 C)     Temp Source 03/31/16 1859 Oral     SpO2 03/31/16 1859 99 %     Weight --      Height --      Head Cir --      Peak Flow --       Pain Score 03/31/16 1900 6     Pain Loc --      Pain Edu? --      Excl. in Lane? --      Constitutional: Alert and oriented. Well appearing and in no acute distress. Eyes: Conjunctivae are normal. PERRL. EOMI without pain.  Head: Atraumatic. ENT:      Ears: edema and crusting of right external ear canal, unable to visualize tympanic membrane due to swelling and yellowish discharge. Tenderness to palpation of tragus, with manipulation of pinna, and preauricular lymph nodes. No mastoid tenderness bilaterally. Left ear with normal canal, without crusting or obvious deformity, tympanic membrane mostly obstructed by cerumen.        Nose: No congestion/rhinnorhea.      Mouth/Throat: Mucous membranes are moist. Uvula is midline. Pharynx without erythema, swelling, exudates Neck: Tenderness to palpation of R neck near angle of the mandible.  Hematological/Lymphatic/Immunilogical: No cervical lymphadenopathy. Cardiovascular: Normal rate, regular rhythm. Grossly normal heart sounds. Respiratory: Normal respiratory effort without tachypnea or retractions.  Neurologic:  Normal speech and language. No gross focal neurologic deficits are appreciated. Normal gait and posture Skin:  Skin is warm, dry and intact. No rash noted. Psychiatric: Mood and affect are normal. Speech and behavior are normal. Patient exhibits appropriate insight and judgement.   ____________________________________________   LABS  None ____________________________________________  EKG  None ____________________________________________  RADIOLOGY  None ____________________________________________    PROCEDURES  Procedure(s) performed: None   Medications - No data to display    ____________________________________________   INITIAL IMPRESSION / ASSESSMENT AND PLAN / ED COURSE  Patient's diagnosis is consistent with right otitis externa. Patient will be discharged home with prescriptions for Cortisporin otic  solution and prednisone to take as directed. Patient is to continue amoxicillin as previously prescribed by her primary care provider. Patient is to follow up with her primary care provider or Dr. Richardson Landry in ENT if symptoms persist past this treatment course. Patient is given ED precautions to return to the ED for any worsening or new symptoms.    ____________________________________________  FINAL CLINICAL IMPRESSION(S) / ED DIAGNOSES  Final diagnoses:  Right otitis externa      NEW MEDICATIONS STARTED DURING THIS VISIT:  Discharge Medication List as of 03/31/2016  7:22 PM    START taking these medications   Details  neomycin-polymyxin-hydrocortisone (CORTISPORIN) otic solution Place 3 drops into the right ear 4 (four) times daily., Starting 03/31/2016, Until Discontinued, Print    predniSONE (DELTASONE) 20 MG tablet Take 2 tablets by mouth,  once daily, for 5 days, Print             Braxton Feathers, PA-C 03/31/16 2050  Delman Kitten, MD 04/01/16 [phone]

## 2016-06-29 IMAGING — US US OB COMP LESS 14 WK
1 series · 13 of 28 positions shown · non-contrast
Comparison: Pelvic ultrasound 03/29/2014

CLINICAL DATA: Patient with lower abdominal pain. Patient is
pregnant.

EXAM:
OBSTETRIC <14 WK US AND TRANSVAGINAL OB US
TECHNIQUE: Both transabdominal and transvaginal ultrasound examinations were
performed for complete evaluation of the gestation as well as the
maternal uterus, adnexal regions, and pelvic cul-de-sac.
Transvaginal technique was performed to assess early pregnancy.

[Series 1: us ob comp less 14 wks · 13 of 51 slices shown]
[im 2/51]
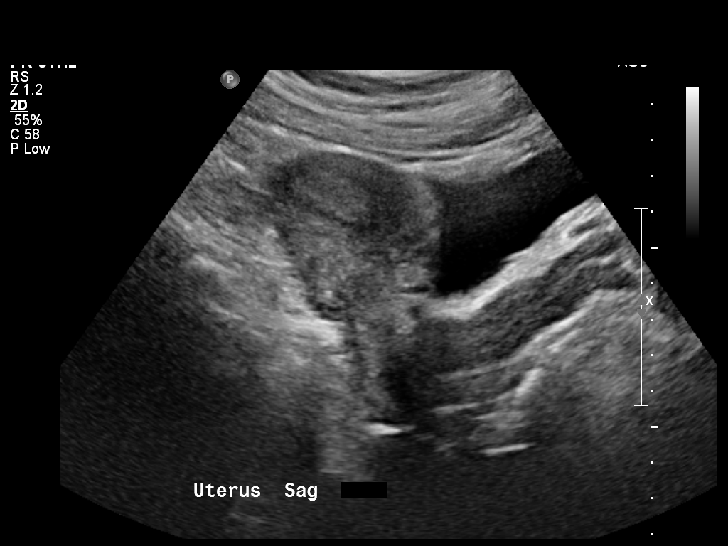
[im 6/51]
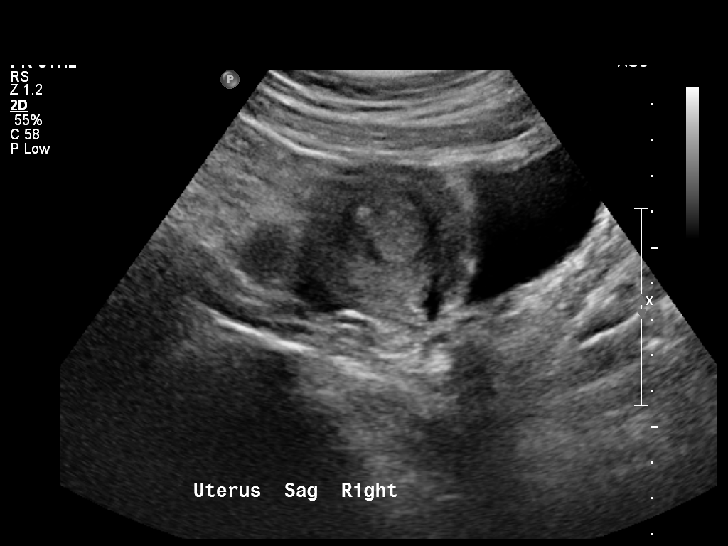
[im 10/51]
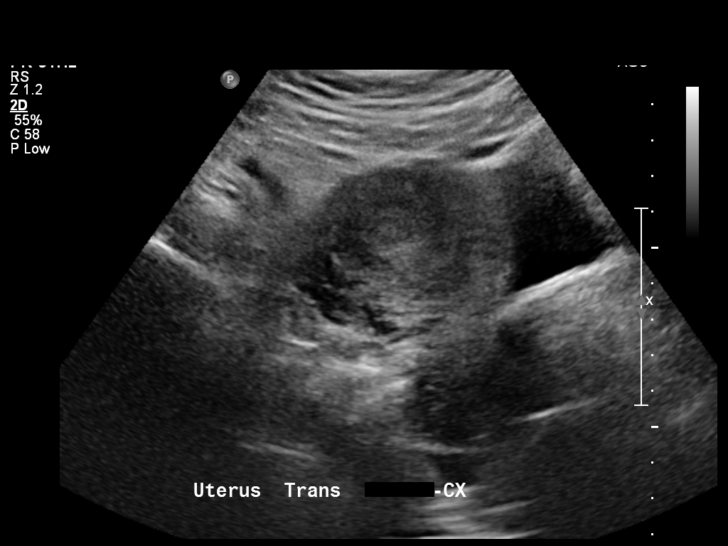
[im 13/51]
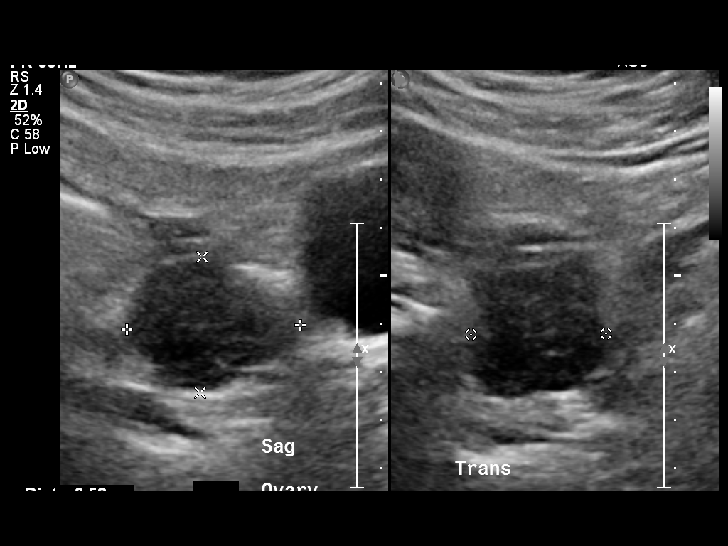
[im 17/51]
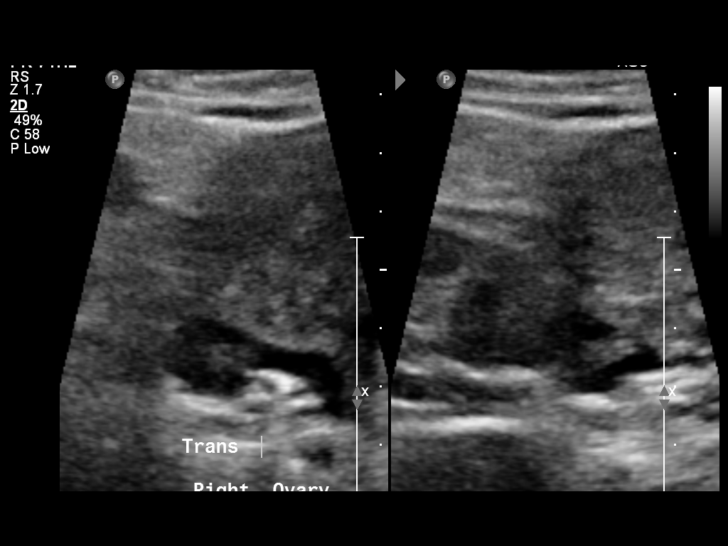
[im 21/51]
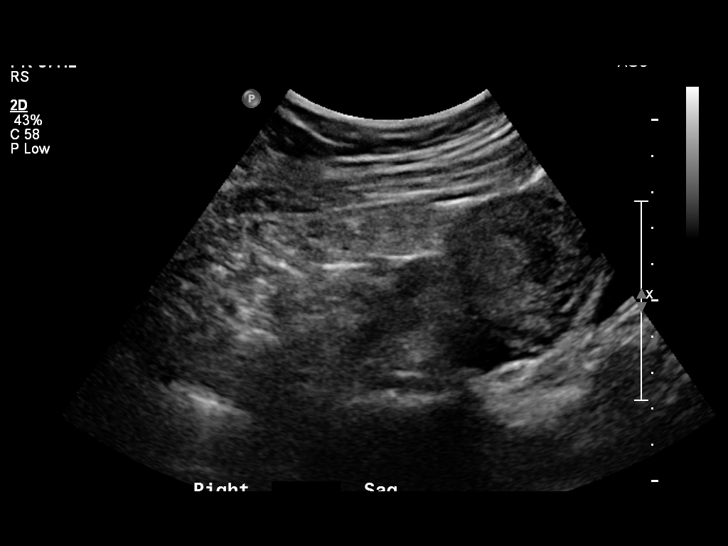
[im 26/51]
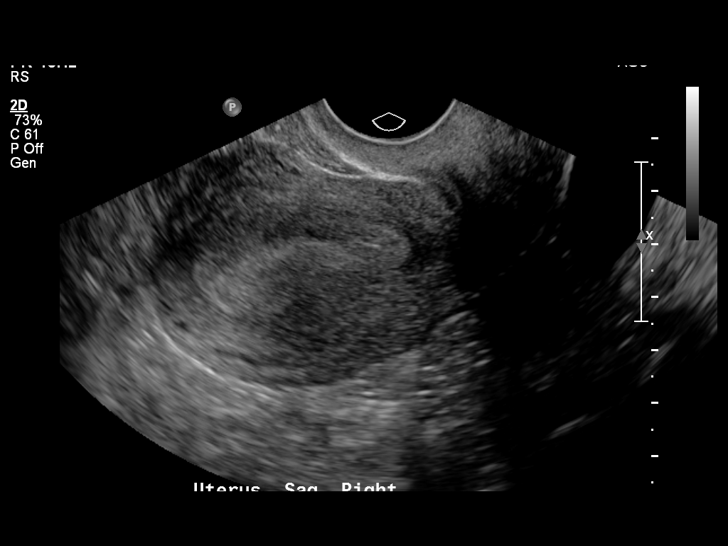
[im 30/51]
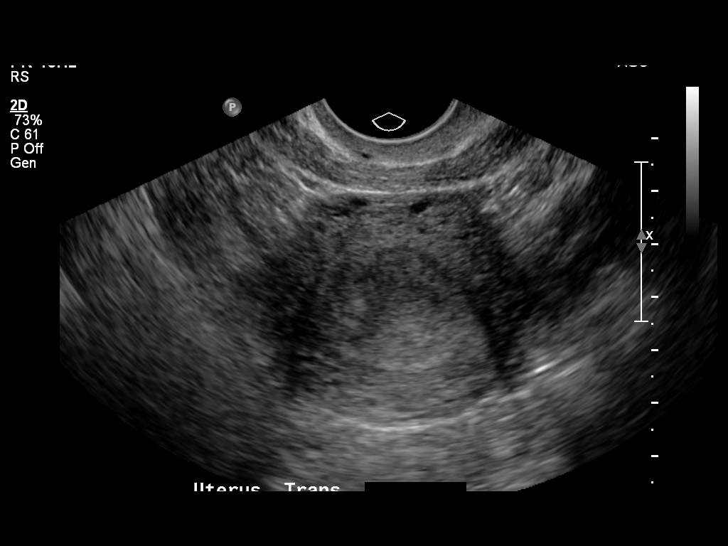
[im 34/51]
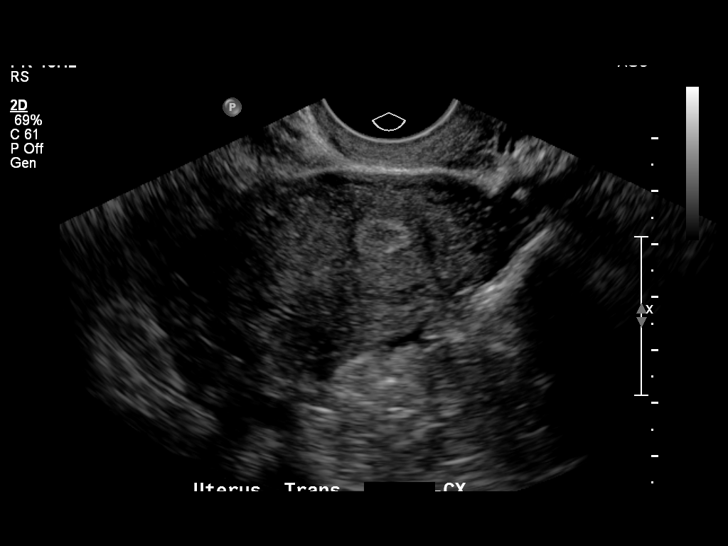
[im 38/51]
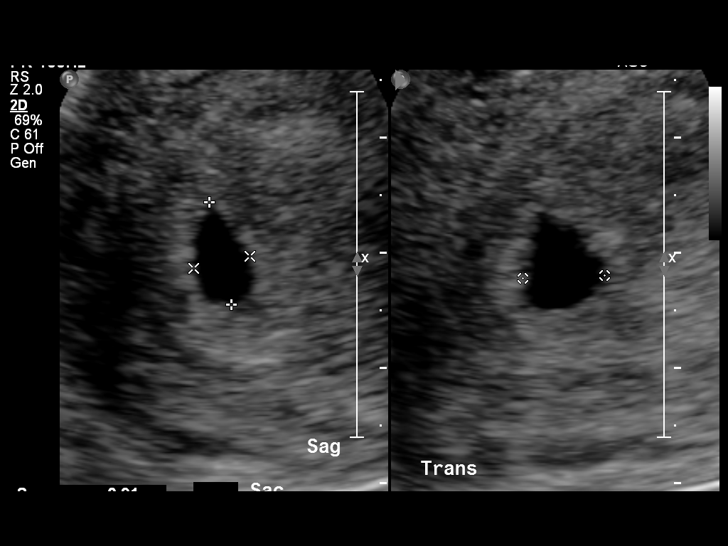
[im 41/51]
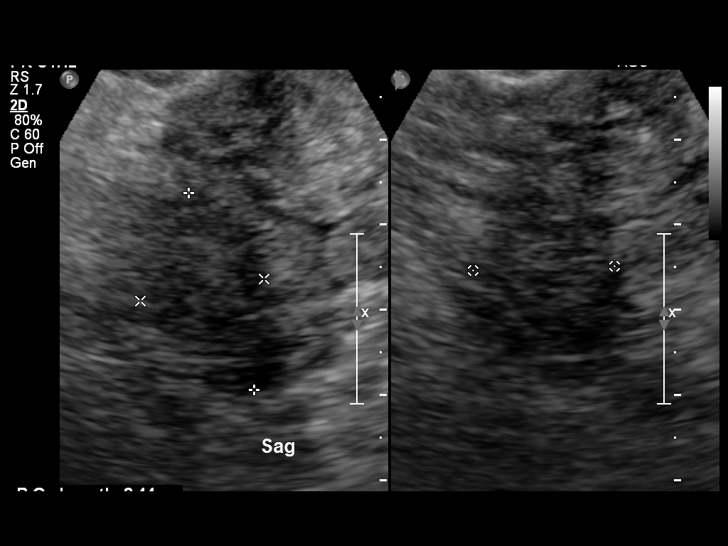
[im 45/51]
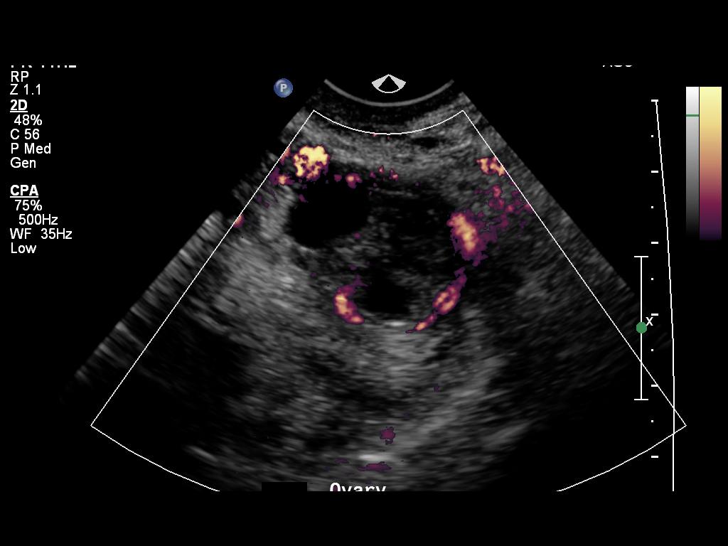
[im 49/51]
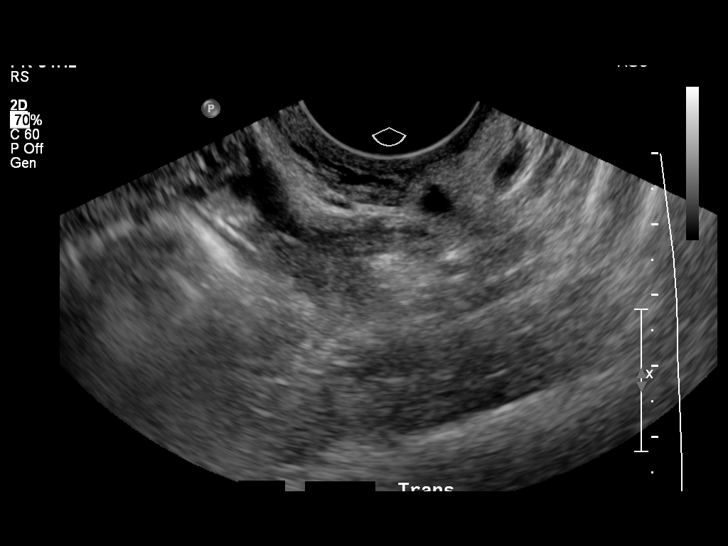

[13 of 28 positions shown; findings below may reference images not displayed]

FINDINGS: Intrauterine gestational sac: Visualized/normal in shape.

Yolk sac:  Not present

Embryo:  Not present

Cardiac Activity: Not present

MSD:  7.1 mm  mm   5 w   2  d

Maternal uterus/adnexae: Suboptimal visualization of the right
ovary. Corpus luteal cyst noted within the left ovary. Small amount
of free fluid in the pelvis. No subchorionic hemorrhage.
IMPRESSION: Probable early intrauterine gestational sac, but no yolk sac, fetal
pole, or cardiac activity yet visualized. Recommend follow-up
quantitative B-HCG levels and follow-up US in 14 days to confirm and
assess viability. This recommendation follows SRU consensus
guidelines: Diagnostic Criteria for Nonviable Pregnancy Early in the
First Trimester. N Engl J Med 1564; [DATE].

## 2022-01-13 ENCOUNTER — Emergency Department
Admission: EM | Admit: 2022-01-13 | Discharge: 2022-01-13 | Disposition: A | Discharge status: Home / Self Care | Payer: 59 | Attending: Emergency Medicine | Admitting: Emergency Medicine

## 2022-01-13 ENCOUNTER — Other Ambulatory Visit: Payer: Self-pay

## 2022-01-13 ENCOUNTER — Encounter: Payer: Self-pay | Admitting: Emergency Medicine

## 2022-01-13 DIAGNOSIS — H6690 Otitis media, unspecified, unspecified ear: Secondary | ICD-10-CM

## 2022-01-13 DIAGNOSIS — H9202 Otalgia, left ear: Secondary | ICD-10-CM

## 2022-01-13 DIAGNOSIS — H6692 Otitis media, unspecified, left ear: Secondary | ICD-10-CM | POA: Insufficient documentation

## 2022-01-13 MED ORDER — AMOXICILLIN 500 MG PO CAPS
500.0000 mg | ORAL_CAPSULE | Freq: Once | ORAL | Status: AC
  Administered 2022-01-13: 500 mg via ORAL
  Filled 2022-01-13: qty 1

## 2022-01-13 MED ORDER — LIDOCAINE HCL (PF) 1 % IJ SOLN
2.0000 mL | Freq: Once | INTRAMUSCULAR | Status: AC
  Administered 2022-01-13: 2 mL
  Filled 2022-01-13: qty 5

## 2022-01-13 MED ORDER — AMOXICILLIN 500 MG PO CAPS: 500.0000 mg | ORAL_CAPSULE | Freq: Three times a day (TID) | ORAL | 0 refills | Status: AC

## 2022-01-13 MED ORDER — IBUPROFEN 600 MG PO TABS
600.0000 mg | ORAL_TABLET | Freq: Once | ORAL | Status: AC
  Administered 2022-01-13: 600 mg via ORAL
  Filled 2022-01-13: qty 1

## 2022-01-13 NOTE — Discharge Instructions (Signed)
1.  Take antibiotic as prescribed (Amoxicillin '500mg'$   ?3 times daily x7 days). ?2.  You may take Tylenol and/or Ibuprofen as needed for discomfort. ?3.  Return to the ER for worsening symptoms, persistent vomiting, difficulty breathing or other concerns. ?

## 2022-01-13 NOTE — ED Provider Notes (Signed)
? ?[facility] ?Provider Note ? ? ? Event Date/Time  ? First MD Initiated Contact with Patient 01/13/22 0327   ?  (approximate) ? ? ?History  ? ?Otalgia ? ? ?HPI ? ?Monica Buck is a 36 y.o. female who presents to the ED from home with a 1 day history of left earache.  Reports pain radiating into her left face.  Denies associated fever, discharge, barotrauma, swimming, nausea, vomiting or dizziness.  Voices no other complaints. ?  ? ? ?Past Medical History  ? ?Past Medical History:  ?Diagnosis Date  ?? Depression   ? r/t divorce  ?? HSV (herpes simplex virus) anogenital infection   ?? Thrombocytopenia ([place])   ? ? ? ?Active Problem List  ? ?Patient Active Problem List  ? Diagnosis Date Noted  ?? Active labor 04/02/2015  ?? Post-term pregnancy, 40-42 weeks of gestation   ?? Echogenic focus of bowel of fetal affecting antepartum care of mother   ?? Fetal echogenic bowel of fetus   ?? Family history of Down syndrome 09/17/2014  ? ? ? ?Past Surgical History  ? ?Past Surgical History:  ?Procedure Laterality Date  ?? NO PAST SURGERIES    ? ? ? ?Home Medications  ? ?Prior to Admission medications   ?Medication Sig Start Date End Date Taking? Authorizing Provider  ?amoxicillin (AMOXIL) 500 MG capsule Take 1 capsule (500 mg total) by mouth 3 (three) times daily. 01/13/22  Yes Paulette Blanch, MD  ?acetaminophen (TYLENOL) 500 MG tablet Take 1,000 mg by mouth every 6 (six) hours as needed for moderate pain.    [provider]  ?docusate sodium (COLACE) 100 MG capsule Take 1 capsule (100 mg total) by mouth every 12 (twelve) hours. 04/10/15   West Pugh, NP  ?neomycin-polymyxin-hydrocortisone (CORTISPORIN) otic solution Place 3 drops into the right ear 4 (four) times daily. 03/31/16   Hagler, Jami L, PA-C  ?predniSONE (DELTASONE) 20 MG tablet Take 2 tablets by mouth, once daily, for 5 days 03/31/16   Hagler, Jami L, PA-C  ?Prenatal Multivit-Min-Fe-FA (PRENATAL VITAMINS PO) Take 1 tablet by mouth  daily.     [provider]  ?valACYclovir (VALTREX) 1000 MG tablet Take 500 mg by mouth 2 (two) times daily.    [provider]  ? ? ? ?Allergies  ?Pork-derived products ? ? ?Family History  ? ?Family History  ?Problem Relation Age of Onset  ?? Cancer Father   ?? Down syndrome Sister   ? ? ? ?Physical Exam  ?Triage Vital Signs: ?ED Triage Vitals  ?Enc Vitals Group  ?   BP 01/13/22 0311 114/70  ?   Pulse Rate 01/13/22 0311 72  ?   Resp 01/13/22 0311 18  ?   Temp 01/13/22 0311 98.2 ?F (36.8 ?C)  ?   Temp Source 01/13/22 0311 Oral  ?   SpO2 01/13/22 0311 98 %  ?   Weight 01/13/22 0306 164 lb (74.4 kg)  ?   Height 01/13/22 0306 '5\' 3"'$  (1.6 m)  ?   Head Circumference --   ?   Peak Flow --   ?   Pain Score 01/13/22 0306 10  ?   Pain Loc --   ?   Pain Edu? --   ?   Excl. in [place]? --   ? ? ?Updated Vital Signs: ?BP 114/70 (BP Location: Right Arm)   Pulse 72   Temp 98.2 ?F (36.8 ?C) (Oral)   Resp 18   Ht  $'5\' 3"'C$  (1.6 m)   Wt 74.4 kg   LMP 01/04/2022 (Exact Date)   SpO2 98%   BMI 29.05 kg/m?  ? ? ?General: Awake, mild distress.  ?CV:  RRR.  Good peripheral perfusion.  ?Resp:  Normal effort.  CTA B. ?Abd:  No distention.  ?Other:  Right ear: TM unremarkable.  Left ear: Outer ear and external canal unremarkable.  Erythematous TM without rupture. ? ? ?ED Results / Procedures / Treatments  ?Labs ?(all labs ordered are listed, but only abnormal results are displayed) ?Labs Reviewed - No data to display ? ? ?EKG ? ?None ? ? ?RADIOLOGY ?None ? ? ?Official radiology report(s): ?No results found. ? ? ?PROCEDURES: ? ?Critical Care performed: No ? ?Procedures ? ? ?MEDICATIONS ORDERED IN ED: ?Medications  ?amoxicillin (AMOXIL) capsule 500 mg (has no administration in time range)  ?lidocaine (PF) (XYLOCAINE) 1 % injection 2 mL (has no administration in time range)  ?ibuprofen (ADVIL) tablet 600 mg (has no administration in time range)  ? ? ? ?IMPRESSION / MDM / ASSESSMENT AND PLAN / ED COURSE  ?I reviewed the triage  vital signs and the nursing notes. ?             ?               ?36 year old female presenting with left otalgia secondary to otitis media.  Will administer ibuprofen, apply lidocaine eardrops for pain and start amoxicillin.  Strict return precautions given.  Patient verbalizes understanding and agrees with plan of care. ? ?FINAL CLINICAL IMPRESSION(S) / ED DIAGNOSES  ? ?Final diagnoses:  ?Otalgia of left ear  ?Acute otitis media, unspecified otitis media type  ? ? ? ?Rx / DC Orders  ? ?ED Discharge Orders   ? ?      Ordered  ?  amoxicillin (AMOXIL) 500 MG capsule  3 times daily       ? 01/13/22 0333  ? ?  ?  ? ?  ? ? ? ?Note:  This document was prepared using Dragon voice recognition software and may include unintentional dictation errors. ?  ?Paulette Blanch, MD ?01/13/22 [phone] ? ?

## 2022-01-13 NOTE — ED Triage Notes (Signed)
Patient ambulatory to triage with steady gait, without difficulty or distress noted; pt reports left earache since yesterday ?

## 2023-03-13 DIAGNOSIS — U071 COVID-19: Secondary | ICD-10-CM | POA: Diagnosis not present

## 2023-03-13 DIAGNOSIS — R110 Nausea: Secondary | ICD-10-CM | POA: Diagnosis not present

## 2023-03-13 DIAGNOSIS — R509 Fever, unspecified: Secondary | ICD-10-CM | POA: Diagnosis not present

## 2023-05-03 DIAGNOSIS — F411 Generalized anxiety disorder: Secondary | ICD-10-CM | POA: Diagnosis not present

## 2023-06-09 DIAGNOSIS — F411 Generalized anxiety disorder: Secondary | ICD-10-CM | POA: Diagnosis not present

## 2023-06-23 DIAGNOSIS — F411 Generalized anxiety disorder: Secondary | ICD-10-CM | POA: Diagnosis not present

## 2023-07-07 DIAGNOSIS — F411 Generalized anxiety disorder: Secondary | ICD-10-CM | POA: Diagnosis not present

## 2023-07-15 ENCOUNTER — Other Ambulatory Visit: Payer: Self-pay | Admitting: Pediatrics

## 2023-07-15 DIAGNOSIS — G43909 Migraine, unspecified, not intractable, without status migrainosus: Secondary | ICD-10-CM

## 2023-07-28 ENCOUNTER — Encounter: Payer: Self-pay | Admitting: Pediatrics

## 2023-07-29 ENCOUNTER — Encounter: Payer: Self-pay | Admitting: Pediatrics

## 2023-08-02 ENCOUNTER — Ambulatory Visit
Admission: RE | Admit: 2023-08-02 | Discharge: 2023-08-02 | Disposition: A | Discharge status: Home / Self Care | Payer: Self-pay | Source: Ambulatory Visit | Attending: Pediatrics | Admitting: Pediatrics

## 2023-08-02 DIAGNOSIS — R519 Headache, unspecified: Secondary | ICD-10-CM | POA: Diagnosis not present

## 2023-08-02 DIAGNOSIS — G43909 Migraine, unspecified, not intractable, without status migrainosus: Secondary | ICD-10-CM

## 2023-08-11 DIAGNOSIS — F411 Generalized anxiety disorder: Secondary | ICD-10-CM | POA: Diagnosis not present

## 2023-08-11 DIAGNOSIS — G935 Compression of brain: Secondary | ICD-10-CM | POA: Diagnosis not present

## 2023-08-11 DIAGNOSIS — Z1389 Encounter for screening for other disorder: Secondary | ICD-10-CM | POA: Diagnosis not present

## 2023-08-17 DIAGNOSIS — F411 Generalized anxiety disorder: Secondary | ICD-10-CM | POA: Diagnosis not present

## 2023-09-01 DIAGNOSIS — F411 Generalized anxiety disorder: Secondary | ICD-10-CM | POA: Diagnosis not present

## 2023-09-12 DIAGNOSIS — R209 Unspecified disturbances of skin sensation: Secondary | ICD-10-CM | POA: Diagnosis not present

## 2023-09-12 DIAGNOSIS — R519 Headache, unspecified: Secondary | ICD-10-CM | POA: Diagnosis not present

## 2023-09-15 DIAGNOSIS — F411 Generalized anxiety disorder: Secondary | ICD-10-CM | POA: Diagnosis not present

## 2023-09-29 DIAGNOSIS — F411 Generalized anxiety disorder: Secondary | ICD-10-CM | POA: Diagnosis not present

## 2023-09-30 DIAGNOSIS — M50222 Other cervical disc displacement at C5-C6 level: Secondary | ICD-10-CM | POA: Diagnosis not present

## 2023-09-30 DIAGNOSIS — G935 Compression of brain: Secondary | ICD-10-CM | POA: Diagnosis not present

## 2023-09-30 DIAGNOSIS — R209 Unspecified disturbances of skin sensation: Secondary | ICD-10-CM | POA: Diagnosis not present

## 2023-10-06 DIAGNOSIS — F411 Generalized anxiety disorder: Secondary | ICD-10-CM | POA: Diagnosis not present
